# Patient Record
Sex: Female | Born: 1940 | Race: White | Hispanic: No | Smoking: Former smoker
Health system: Southern US, Community
[De-identification: ages and names within clinical notes are randomized; demographics above are authoritative.]

## PROBLEM LIST (undated history)

## (undated) DIAGNOSIS — M7580 Other shoulder lesions, unspecified shoulder: Secondary | ICD-10-CM

## (undated) DIAGNOSIS — E7800 Pure hypercholesterolemia, unspecified: Secondary | ICD-10-CM

## (undated) DIAGNOSIS — M1990 Unspecified osteoarthritis, unspecified site: Secondary | ICD-10-CM

## (undated) DIAGNOSIS — E079 Disorder of thyroid, unspecified: Secondary | ICD-10-CM

## (undated) DIAGNOSIS — F209 Schizophrenia, unspecified: Secondary | ICD-10-CM

## (undated) DIAGNOSIS — K449 Diaphragmatic hernia without obstruction or gangrene: Secondary | ICD-10-CM

## (undated) DIAGNOSIS — R569 Unspecified convulsions: Secondary | ICD-10-CM

## (undated) HISTORY — DX: Unspecified convulsions: R56.9

## (undated) HISTORY — DX: Pure hypercholesterolemia, unspecified: E78.00

## (undated) HISTORY — PX: CHOLECYSTECTOMY: SHX55

## (undated) HISTORY — DX: Disorder of thyroid, unspecified: E07.9

## (undated) HISTORY — PX: OTHER SURGICAL HISTORY: SHX169

## (undated) HISTORY — DX: Schizophrenia, unspecified: F20.9

## (undated) HISTORY — DX: Other shoulder lesions, unspecified shoulder: M75.80

## (undated) HISTORY — DX: Diaphragmatic hernia without obstruction or gangrene: K44.9

## (undated) HISTORY — DX: Unspecified osteoarthritis, unspecified site: M19.90

---

## 2002-07-08 ENCOUNTER — Ambulatory Visit (HOSPITAL_COMMUNITY): Admission: RE | Admit: 2002-07-08 | Discharge: 2002-07-08 | Payer: Self-pay | Admitting: Internal Medicine

## 2002-07-08 ENCOUNTER — Encounter: Payer: Self-pay | Admitting: Internal Medicine

## 2002-08-01 ENCOUNTER — Encounter: Admission: RE | Admit: 2002-08-01 | Discharge: 2002-08-01 | Payer: Self-pay | Admitting: *Deleted

## 2002-08-01 ENCOUNTER — Encounter: Payer: Self-pay | Admitting: *Deleted

## 2002-08-15 ENCOUNTER — Encounter: Admission: RE | Admit: 2002-08-15 | Discharge: 2002-08-15 | Payer: Self-pay | Admitting: *Deleted

## 2002-08-15 ENCOUNTER — Encounter: Payer: Self-pay | Admitting: *Deleted

## 2002-08-29 ENCOUNTER — Encounter: Payer: Self-pay | Admitting: *Deleted

## 2002-08-29 ENCOUNTER — Encounter: Admission: RE | Admit: 2002-08-29 | Discharge: 2002-08-29 | Payer: Self-pay | Admitting: *Deleted

## 2002-10-11 ENCOUNTER — Encounter: Admission: RE | Admit: 2002-10-11 | Discharge: 2002-10-11 | Payer: Self-pay | Admitting: *Deleted

## 2002-10-11 ENCOUNTER — Encounter: Payer: Self-pay | Admitting: *Deleted

## 2002-12-13 ENCOUNTER — Ambulatory Visit (HOSPITAL_COMMUNITY): Admission: RE | Admit: 2002-12-13 | Discharge: 2002-12-13 | Payer: Self-pay | Admitting: Orthopaedic Surgery

## 2003-01-20 ENCOUNTER — Inpatient Hospital Stay (HOSPITAL_COMMUNITY): Admission: RE | Admit: 2003-01-20 | Discharge: 2003-01-22 | Payer: Self-pay | Admitting: Orthopaedic Surgery

## 2003-04-02 ENCOUNTER — Ambulatory Visit (HOSPITAL_COMMUNITY): Admission: RE | Admit: 2003-04-02 | Discharge: 2003-04-02 | Payer: Self-pay | Admitting: Orthopaedic Surgery

## 2003-08-19 ENCOUNTER — Encounter: Payer: Self-pay | Admitting: Orthopedic Surgery

## 2004-07-30 ENCOUNTER — Ambulatory Visit: Payer: Self-pay | Admitting: *Deleted

## 2004-11-24 ENCOUNTER — Ambulatory Visit: Payer: Self-pay | Admitting: Orthopedic Surgery

## 2005-01-18 ENCOUNTER — Ambulatory Visit: Payer: Self-pay | Admitting: Psychiatry

## 2005-02-16 ENCOUNTER — Ambulatory Visit: Payer: Self-pay | Admitting: Orthopedic Surgery

## 2005-05-16 ENCOUNTER — Ambulatory Visit: Payer: Self-pay | Admitting: Orthopedic Surgery

## 2005-06-02 ENCOUNTER — Emergency Department (HOSPITAL_COMMUNITY): Admission: EM | Admit: 2005-06-02 | Discharge: 2005-06-02 | Payer: Self-pay | Admitting: Emergency Medicine

## 2005-07-26 ENCOUNTER — Ambulatory Visit: Payer: Self-pay | Admitting: Psychiatry

## 2005-08-15 ENCOUNTER — Ambulatory Visit: Payer: Self-pay | Admitting: Orthopedic Surgery

## 2005-11-23 ENCOUNTER — Ambulatory Visit: Payer: Self-pay | Admitting: Orthopedic Surgery

## 2005-12-12 ENCOUNTER — Ambulatory Visit: Payer: Self-pay | Admitting: Orthopedic Surgery

## 2006-01-17 ENCOUNTER — Ambulatory Visit (HOSPITAL_COMMUNITY): Payer: Self-pay | Admitting: Psychiatry

## 2006-01-23 ENCOUNTER — Ambulatory Visit: Payer: Self-pay | Admitting: Orthopedic Surgery

## 2006-02-20 ENCOUNTER — Ambulatory Visit: Payer: Self-pay | Admitting: Orthopedic Surgery

## 2006-06-12 ENCOUNTER — Ambulatory Visit: Payer: Self-pay | Admitting: Orthopedic Surgery

## 2006-07-18 ENCOUNTER — Ambulatory Visit (HOSPITAL_COMMUNITY): Payer: Self-pay | Admitting: Psychiatry

## 2006-09-11 ENCOUNTER — Ambulatory Visit: Payer: Self-pay | Admitting: Orthopedic Surgery

## 2006-11-13 ENCOUNTER — Ambulatory Visit: Payer: Self-pay | Admitting: Orthopedic Surgery

## 2007-01-09 ENCOUNTER — Ambulatory Visit (HOSPITAL_COMMUNITY): Payer: Self-pay | Admitting: Psychiatry

## 2007-02-12 ENCOUNTER — Ambulatory Visit: Payer: Self-pay | Admitting: Orthopedic Surgery

## 2007-05-21 ENCOUNTER — Ambulatory Visit: Payer: Self-pay | Admitting: Orthopedic Surgery

## 2007-06-26 ENCOUNTER — Ambulatory Visit (HOSPITAL_COMMUNITY): Payer: Self-pay | Admitting: Psychiatry

## 2007-08-20 ENCOUNTER — Encounter (INDEPENDENT_AMBULATORY_CARE_PROVIDER_SITE_OTHER): Payer: Self-pay | Admitting: Radiology

## 2007-08-20 ENCOUNTER — Ambulatory Visit: Payer: Self-pay | Admitting: Orthopedic Surgery

## 2007-11-19 ENCOUNTER — Ambulatory Visit: Payer: Self-pay | Admitting: Orthopedic Surgery

## 2007-11-19 DIAGNOSIS — M25559 Pain in unspecified hip: Secondary | ICD-10-CM

## 2007-12-04 ENCOUNTER — Ambulatory Visit (HOSPITAL_COMMUNITY): Payer: Self-pay | Admitting: Psychiatry

## 2008-02-20 ENCOUNTER — Ambulatory Visit: Payer: Self-pay | Admitting: Orthopedic Surgery

## 2008-05-21 ENCOUNTER — Ambulatory Visit: Payer: Self-pay | Admitting: Orthopedic Surgery

## 2008-05-27 ENCOUNTER — Ambulatory Visit (HOSPITAL_COMMUNITY): Payer: Self-pay | Admitting: Psychiatry

## 2008-06-24 ENCOUNTER — Ambulatory Visit (HOSPITAL_COMMUNITY): Payer: Self-pay | Admitting: Psychiatry

## 2008-07-22 ENCOUNTER — Ambulatory Visit (HOSPITAL_COMMUNITY): Payer: Self-pay | Admitting: Psychiatry

## 2008-08-19 ENCOUNTER — Ambulatory Visit: Payer: Self-pay | Admitting: Orthopedic Surgery

## 2008-09-02 ENCOUNTER — Ambulatory Visit (HOSPITAL_COMMUNITY): Payer: Self-pay | Admitting: Psychiatry

## 2008-11-19 ENCOUNTER — Ambulatory Visit: Payer: Self-pay | Admitting: Orthopedic Surgery

## 2009-02-03 ENCOUNTER — Ambulatory Visit (HOSPITAL_COMMUNITY): Payer: Self-pay | Admitting: Psychiatry

## 2009-02-25 ENCOUNTER — Ambulatory Visit: Payer: Self-pay | Admitting: Orthopedic Surgery

## 2009-03-24 ENCOUNTER — Telehealth: Payer: Self-pay | Admitting: Orthopedic Surgery

## 2009-05-12 ENCOUNTER — Ambulatory Visit (HOSPITAL_COMMUNITY): Payer: Self-pay | Admitting: Psychiatry

## 2009-05-25 ENCOUNTER — Ambulatory Visit: Payer: Self-pay | Admitting: Orthopedic Surgery

## 2009-05-25 DIAGNOSIS — M19049 Primary osteoarthritis, unspecified hand: Secondary | ICD-10-CM | POA: Insufficient documentation

## 2009-08-11 ENCOUNTER — Ambulatory Visit (HOSPITAL_COMMUNITY): Payer: Self-pay | Admitting: Psychiatry

## 2009-08-31 ENCOUNTER — Ambulatory Visit: Payer: Self-pay | Admitting: Orthopedic Surgery

## 2009-11-30 ENCOUNTER — Ambulatory Visit: Payer: Self-pay | Admitting: Orthopedic Surgery

## 2009-12-15 ENCOUNTER — Ambulatory Visit (HOSPITAL_COMMUNITY): Payer: Self-pay | Admitting: Psychiatry

## 2010-03-01 ENCOUNTER — Ambulatory Visit: Payer: Self-pay | Admitting: Orthopedic Surgery

## 2010-03-09 ENCOUNTER — Ambulatory Visit (HOSPITAL_COMMUNITY): Payer: Self-pay | Admitting: Psychiatry

## 2010-06-08 ENCOUNTER — Ambulatory Visit (HOSPITAL_COMMUNITY): Payer: Self-pay | Admitting: Psychiatry

## 2010-06-16 ENCOUNTER — Ambulatory Visit: Payer: Self-pay | Admitting: Orthopedic Surgery

## 2010-08-17 ENCOUNTER — Ambulatory Visit (HOSPITAL_COMMUNITY): Payer: Self-pay | Admitting: Psychiatry

## 2010-09-29 ENCOUNTER — Ambulatory Visit: Payer: Self-pay | Admitting: Orthopedic Surgery

## 2010-09-29 DIAGNOSIS — M5430 Sciatica, unspecified side: Secondary | ICD-10-CM | POA: Insufficient documentation

## 2010-11-16 ENCOUNTER — Ambulatory Visit (HOSPITAL_COMMUNITY)
Admission: RE | Admit: 2010-11-16 | Discharge: 2010-11-16 | Payer: Self-pay | Source: Home / Self Care | Attending: Psychiatry | Admitting: Psychiatry

## 2010-11-30 NOTE — Assessment & Plan Note (Signed)
Summary: 3 M RE-CK LT HIP,RT THUMB/POSS INJEC/BLUE MEDICARE/CAF   Visit Type:  Follow-up  CC:  left hip pain .  History of Present Illness: For injection left hip   Verbal consent was obtained. The hip over the greater trochanter was prepped with alcohol and ethyl chloride. depomedrol 40mg /cc and sensorcaine .25% 1 cc each was injected. there were no coplications.    Allergies: 1)  ! * Thorizine   Other Orders: Joint Aspirate / Injection, Large (20610) Depo- Medrol 40mg  (J1030)  Patient Instructions: 1)  You have received an injection of cortisone today. You may experience increased pain at the injection site. Apply ice pack to the area for 20 minutes every 2 hours and take 2 xtra strength tylenol every 8 hours. This increased pain will usually resolve in 24 hours. The injection will take effect in 3-10 days.  2)  Please schedule a follow-up appointment in 3 months.

## 2010-11-30 NOTE — Assessment & Plan Note (Signed)
Summary: 3 M RE-CK LT HIP/POSS INJEC/BLUE MEDICARE/CAF   Visit Type:  Follow-up  CC:  left hip pain.  History of Present Illness: Sherry Navarro comes in for her three-month injection LEFT hip  I am injection given over the LEFT hip and trochanter.  Verbal consent was obtained. The hip over the greater trochanter was prepped with alcohol and ethyl chloride. depomedrol 40mg /cc and sensorcaine .25% 1 cc each was injected. there were no coplications.   Allergies: 1)  ! * Thorizine   Other Orders: Joint Aspirate / Injection, Large (20610) Depo- Medrol 40mg  (J1030)  Patient Instructions: 1)  Please schedule a follow-up appointment in 3 months. 2)  You have received an injection of cortisone today. You may experience increased pain at the injection site. Apply ice pack to the area for 20 minutes every 2 hours and take 2 xtra strength tylenol every 8 hours. This increased pain will usually resolve in 24 hours. The injection will take effect in 3-10 days.

## 2010-11-30 NOTE — Assessment & Plan Note (Signed)
Summary: 3 M RE-CK LT HIP/POSS. INJEC/MEDICARE,MUT OM/CAF   Visit Type:  Follow-up  CC:  left hip pain.  History of Present Illness: Sherry Navarro comes in for her 3 month LEFT hip injection.  Injection was given the LEFT hip  Verbal consent was obtained. The hip over the greater trochanter was prepped with alcohol and ethyl chloride. depomedrol 40mg /cc and sensorcaine .25% 1 cc each was injected. there were no coplications.    Allergies: 1)  ! * Thorizine   Impression & Recommendations:  Problem # 1:  HAND, ARTHRITIS, DEGEN./OSTEO (ICD-715.94)  Orders: Joint Aspirate / Injection, Large (20610) Depo- Medrol 40mg  (J1030)  Problem # 2:  HIP PAIN (ICD-719.45)  Orders: Joint Aspirate / Injection, Large (20610) Depo- Medrol 40mg  (J1030)  Patient Instructions: 1)  Please schedule a follow-up appointment in 3 months. 2)  You have received an injection of cortisone today. You may experience increased pain at the injection site. Apply ice pack to the area for 20 minutes every 2 hours and take 2 xtra strength tylenol every 8 hours. This increased pain will usually resolve in 24 hours. The injection will take effect in 3-10 days.

## 2010-11-30 NOTE — Assessment & Plan Note (Signed)
Summary: 3 MO RECK LEFT HIP/MCR/[facility]/BSF   Visit Type:  Follow-up  CC:  left hip pain.  History of Present Illness: I saw Brittanni Manship in the office today for a 3 month followup visit.  She is a 70 years old woman with the complaint of:  left hip.  Patient states that her hip is not bothering her.  She is in for routine LEFT IM cortisone injection to control radicular LEFT leg pain. As long as she maintains a regular dosing schedule. This seems to keep things under control.  Repeat injection, LEFT hip IM in the gluteal region.  Depo-Medrol 40 mg per cc and 2 cc of 1% lidocaine were injected under sterile conditions. A refrigerant was used to anesthetize skin  Allergies: 1)  ! * Thorizine   Other Orders: Joint Aspirate / Injection, Large (20610) Depo- Medrol 40mg  (J1030)  Patient Instructions: 1)  Please schedule a follow-up appointment in 3 months.   Orders Added: 1)  Joint Aspirate / Injection, Large [20610] 2)  Depo- Medrol 40mg  [J1030]

## 2011-01-04 ENCOUNTER — Ambulatory Visit: Payer: Self-pay | Admitting: Orthopedic Surgery

## 2011-01-10 ENCOUNTER — Emergency Department (HOSPITAL_COMMUNITY)
Admission: EM | Admit: 2011-01-10 | Discharge: 2011-01-10 | Disposition: A | Discharge status: Home / Self Care | Payer: Medicare Other | Attending: Emergency Medicine | Admitting: Emergency Medicine

## 2011-01-10 DIAGNOSIS — E039 Hypothyroidism, unspecified: Secondary | ICD-10-CM | POA: Insufficient documentation

## 2011-01-10 DIAGNOSIS — B372 Candidiasis of skin and nail: Secondary | ICD-10-CM | POA: Insufficient documentation

## 2011-01-10 DIAGNOSIS — Z79899 Other long term (current) drug therapy: Secondary | ICD-10-CM | POA: Insufficient documentation

## 2011-01-11 ENCOUNTER — Encounter (INDEPENDENT_AMBULATORY_CARE_PROVIDER_SITE_OTHER): Payer: Medicare Other | Admitting: Psychiatry

## 2011-01-11 DIAGNOSIS — F200 Paranoid schizophrenia: Secondary | ICD-10-CM

## 2011-01-19 ENCOUNTER — Encounter: Payer: Self-pay | Admitting: Orthopedic Surgery

## 2011-01-25 ENCOUNTER — Ambulatory Visit (INDEPENDENT_AMBULATORY_CARE_PROVIDER_SITE_OTHER): Payer: Medicare Other | Admitting: Orthopedic Surgery

## 2011-01-25 ENCOUNTER — Encounter: Payer: Self-pay | Admitting: Orthopedic Surgery

## 2011-01-25 DIAGNOSIS — M25559 Pain in unspecified hip: Secondary | ICD-10-CM

## 2011-01-25 NOTE — Patient Instructions (Signed)
You have received a steroid shot. 15% of patients experience increased pain at the injection site with in the next 24 hours. This is best treated with ice and tylenol extra strength 2 tabs every 8 hours. If you are still having pain please call the office.    

## 2011-01-25 NOTE — Progress Notes (Signed)
Inject left hip

## 2011-01-25 NOTE — Discharge Summary (Signed)
Injection LEFT hip  Verbal consent.  Diagnosis LEFT hip pain.  40 mg of Depo-Medrol and 4 cc of 1% lidocaine plain.  Sterile injection LEFT hip.  No complications

## 2011-03-18 NOTE — Op Note (Signed)
NAME:  ANASHA, PERFECTO                           ACCOUNT NO.:  [id number]   MEDICAL RECORD NO.:  [id number]                   PATIENT TYPE:  INP   LOCATION:  2550                                 FACILITY:  [facility]   PHYSICIAN:  Mark C. Ophelia Charter, M.D.                 DATE OF BIRTH:  [birth year]   DATE OF PROCEDURE:  01/20/2003  DATE OF DISCHARGE:                                 OPERATIVE REPORT   PREOPERATIVE DIAGNOSIS:  L4-5 spinal stenosis.   POSTOPERATIVE DIAGNOSIS:  L4-5 spinal stenosis.   OPERATION PERFORMED:  L4-5 decompression.   SURGEON:  Mark C. Ophelia Charter, M.D.   ASSISTANT:  Genene Churn. Denton Meek.   ANESTHESIA:  General orotracheal anesthesia.   ESTIMATED BLOOD LOSS:  100 ml.   COMPLICATIONS:  None.   INDICATIONS FOR PROCEDURE:  After induction of general anesthesia,  orotracheal intubation, the patient was placed in the [place] frame,  kneeling position with careful padding, positioning, and preoperative Ancef  prophylaxis.  A midline incision was made after the area had been squared  with towels after Betadine Vi-drape had been applied after the prep and  laminectomy sheet had been applied.  The needle was at the 4-5 space which  was the operative level.  An incision was made at the midline.  A self-  retaining retractor was placed, subperiosteal dissection of the spinous  process was performed and the spinous process was removed down to the base  extending to the lamina at L4 and the top portion, which was the top fourth  of L5, was taken.  The ligament was extremely hypertrophic.  There was  narrow distance between the facets.  Considerable spur overhang from the  facet and also ligament hypertrophy contributed to the central stenosis.  Dura was identified proximally and big chunks of ligament were removed  initially dorsally and then in both the right and left gutter.  Bone was  removed out to the level of the pedicle.  Pars was preserved and the nerve  root was palpated  at its exit out the foramen, visualized with no area of  compression with the operating microscope which had been draped and was  brought in once the ligament was visualized.  Palpation of the disk showed  that it was flat, not herniated and dura had resumed its round tubular  structure.  After irrigation with saline solution, there is one area central  at the top of L5 that there was concern there might be a possible dural  tear.  The area was gently palpated with the nerve hook and Valsalva was  performed.  There was no spinal fluid leakage noted and the dura was intact.  Fascia was closed with 0 Vicryl, interrupted 2-0  Vicryl in the subcutaneous tissue, Marcaine infiltration in the skin and  skin staple closure, postoperative dressing.  Instrument count, needle count  was correct.  DESCRIPTION OF PROCEDURE:                                                Mark C. Ophelia Charter, M.D.    MCY/MEDQ  D:  01/20/2003  T:  01/20/2003  Job:  [id number]

## 2011-03-18 NOTE — H&P (Signed)
NAME:  Sherry Navarro, Sherry Navarro                           ACCOUNT NO.:  [id number]   MEDICAL RECORD NO.:  [id number]                   PATIENT TYPE:  INP   LOCATION:  2550                                 FACILITY:  [facility]   PHYSICIAN:  Mark C. Ophelia Charter, M.D.                 DATE OF BIRTH:  1941-03-22   DATE OF ADMISSION:  01/20/2003  DATE OF DISCHARGE:                                HISTORY & PHYSICAL   CHIEF COMPLAINT:  Low back pain and neurogenic claudication.   HISTORY OF PRESENT ILLNESS:  The patient is a 70 year old white female with  a history of L4-5 stenosis, low back pain, and neurogenic claudication who  presented to our office for preoperative evaluation for L4-5 decompression.  She has had progressively worsening symptoms with no response to  conservative treatment. A significant decreased in her daily activity level  due to the ongoing complaint.   CURRENT MEDICATIONS:  1. Synthroid 0.125 mg by mouth each day.  2. Navane 10 mg by mouth at bedtime.  3. Tagamet 300 mg by mouth at bedtime.  4. Benztropine 1 mg by mouth at bedtime.  5. Lipitor 10 mg by mouth each day.  6. Neurontin 300 mg 2-3 times each day.  7. Bextra 20 mg by mouth each day.   ALLERGIES:  No known drug allergies.   PAST MEDICAL/SURGICAL HISTORY:  1. Right below the knee amputations in 1997.  2. Open cholecystectomy.  3. Hypothyroidism.  4. History of seizures.  5. Hypercholesterolemia.  6. Parkinson's disease.  7. History of psychotic disorder.   SOCIAL HISTORY:  The patient is married and she is currently unemployed.  Denies smoking or alcohol consumption.   FAMILY HISTORY:  Noncontributory.   REVIEW OF SYSTEMS:  Currently, the patient denies chest pain, shortness of  breath, abdominal pain, nausea and vomiting. No fever, chills, night sweats,  bleeding disorders. All other systems are noncontributory.   PHYSICAL EXAMINATION:  VITAL SIGNS: Temperature 98.5, pulse 76, respiratory  rate 16, blood  pressure 180/84. Height 5'7. Weight 245 pounds.  GENERAL: A pleasant white female who is alert and oriented times three. In  no acute distress.  HEENT: Head normocephalic and atraumatic. Wears eyeglasses. Pupils are  equal, round, and reactive to light and accommodation. Extraocular muscles  intact. Oral mucosa is pink and moist. Tm's clear, pearly gray and intact.  NECK: Full range of motion. Supple and nontender. Carotids are intact. No  lymphadenopathy.  LUNGS: Clear to auscultation and percussion bilaterally.  BREAST: Not examined.  HEART: Regular rate and rhythm. S1 and S2. No murmur, rub, or gallop.  ABDOMEN: Round and protuberant. Normal bowel sounds times four. Soft and  tender. No palpable masses.  GU: Not examined.  EXTREMITIES: The patient walks with a limp. Decreased lumbar flexion  secondary to discomfort.  NEURO: Bilateral sided muscular tenderness. Bilateral hand tremors. No  evidence of motor weakness.  Sensory and motor function intact. Pedal pulses  are intact. Good range of motion  at the hips, knees and ankles.  SKIN: Warm and dry.   DIAGNOSTIC IMPRESSION:  MRI scan showed severe stenosis at L4-5.   ASSESSMENT:  L4-5 stenosis.   PLAN:  L4-5 decompression. Operative versus non-operative treatments  discussed with the patient. Possible risks and complications such as  bleeding, infection, nerve damage, paralysis, spinal fluid leak, headaches,  fractures, anesthesia complications and re-operation also discussed. All  questions were answered and she wishes to proceed with surgery as scheduled.     Genene Churn. Denton Meek.                      Mark C. Ophelia Charter, M.D.    JMO/MEDQ  D:  01/20/2003  T:  01/20/2003  Job:  [id number]

## 2011-04-05 ENCOUNTER — Encounter (INDEPENDENT_AMBULATORY_CARE_PROVIDER_SITE_OTHER): Payer: Medicare Other | Admitting: Psychiatry

## 2011-04-05 DIAGNOSIS — F200 Paranoid schizophrenia: Secondary | ICD-10-CM

## 2011-04-27 ENCOUNTER — Ambulatory Visit: Payer: Medicare Other | Admitting: Orthopedic Surgery

## 2011-05-03 ENCOUNTER — Ambulatory Visit (INDEPENDENT_AMBULATORY_CARE_PROVIDER_SITE_OTHER): Payer: Medicare Other | Admitting: Orthopedic Surgery

## 2011-05-03 DIAGNOSIS — M7070 Other bursitis of hip, unspecified hip: Secondary | ICD-10-CM

## 2011-05-03 DIAGNOSIS — M76899 Other specified enthesopathies of unspecified lower limb, excluding foot: Secondary | ICD-10-CM

## 2011-05-03 MED ORDER — METHYLPREDNISOLONE ACETATE 40 MG/ML IJ SUSP: 40.0000 mg | Freq: Once | INTRAMUSCULAR | Status: DC

## 2011-05-03 NOTE — Progress Notes (Signed)
LEFT hip injection   Verbal consent  Time out  Indications Depo-Medrol 40 mg per cc 1 cc Lidocaine 1% 3 cc   Alcohol was used to prep the skin.  A 22-gauge spinal needle was used to inject the medication.  Ethyl chloride was used to anesthetize the skin  Medication was injected per complications

## 2011-05-03 NOTE — Patient Instructions (Signed)
You have received a steroid shot. 15% of patients experience increased pain at the injection site with in the next 24 hours. This is best treated with ice and tylenol extra strength 2 tabs every 8 hours. If you are still having pain please call the office.    

## 2011-07-05 ENCOUNTER — Encounter (HOSPITAL_COMMUNITY): Payer: Medicare Other | Admitting: Psychiatry

## 2011-07-12 ENCOUNTER — Encounter (INDEPENDENT_AMBULATORY_CARE_PROVIDER_SITE_OTHER): Payer: Medicare Other | Admitting: Psychiatry

## 2011-07-12 DIAGNOSIS — F200 Paranoid schizophrenia: Secondary | ICD-10-CM

## 2011-07-27 ENCOUNTER — Telehealth: Payer: Self-pay | Admitting: Orthopedic Surgery

## 2011-07-27 NOTE — Telephone Encounter (Signed)
Sherry Navarro called this morning complaining of left hip pain for last 2 weeks.  She saw you on 05/03/11 and received an injection.  She is scheduled For another injection 08/09/11 but is asking to come in sooner.  Told her we may not be able to see her before the 08/09/11 appointment.  Says she is taking Nabumetone 750 mgs once a day ( says this was prescribed by Dr. Janna Arch for osteoarithis) and this does not help her pain.  Said  Advil helps when taken with the Nabumetone, but cannot take Advil  because it constipates her.  Can you prescribe anything else for her to take until she Sees you on 08/09/11.  Uses [facility] in [place]

## 2011-07-28 ENCOUNTER — Other Ambulatory Visit: Payer: Self-pay | Admitting: Orthopedic Surgery

## 2011-07-28 DIAGNOSIS — M25559 Pain in unspecified hip: Secondary | ICD-10-CM

## 2011-07-28 MED ORDER — TRAMADOL-ACETAMINOPHEN 37.5-325 MG PO TABS: 1.0000 | ORAL_TABLET | ORAL | Status: AC | PRN

## 2011-07-28 NOTE — Telephone Encounter (Signed)
Pain med faxed to [facility]

## 2011-07-28 NOTE — Telephone Encounter (Signed)
Relayed message to patient

## 2011-07-29 ENCOUNTER — Telehealth: Payer: Self-pay | Admitting: Orthopedic Surgery

## 2011-07-29 NOTE — Telephone Encounter (Signed)
Sherry Navarro cannot take the Ultracet that was faxed in for her pain, because it has Tylenol in it .  Tylenol gives her headaches.  She did not get the prescription.  If you call in something else, she now uses [name]. Her # [phone]

## 2011-07-29 NOTE — Telephone Encounter (Signed)
I DO NOT HAVE ANOTHER RECOMMENDATION ALL OF THE PAIN RELIEVERS HAVE TYLENOL IN THEM

## 2011-08-01 NOTE — Telephone Encounter (Signed)
Advise the patient of doctor's reply

## 2011-08-09 ENCOUNTER — Encounter: Payer: Self-pay | Admitting: Orthopedic Surgery

## 2011-08-09 ENCOUNTER — Ambulatory Visit (INDEPENDENT_AMBULATORY_CARE_PROVIDER_SITE_OTHER): Payer: Medicare Other | Admitting: Orthopedic Surgery

## 2011-08-09 ENCOUNTER — Ambulatory Visit: Payer: Medicare Other | Admitting: Orthopedic Surgery

## 2011-08-09 VITALS — Ht 66.0 in | Wt 231.0 lb

## 2011-08-09 DIAGNOSIS — M25559 Pain in unspecified hip: Secondary | ICD-10-CM

## 2011-08-09 MED ORDER — METHYLPREDNISOLONE ACETATE 40 MG/ML IJ SUSP: 40.0000 mg | Freq: Once | INTRAMUSCULAR | Status: DC

## 2011-08-09 NOTE — Patient Instructions (Signed)
You have received a steroid shot. 15% of patients experience increased pain at the injection site with in the next 24 hours. This is best treated with ice and tylenol extra strength 2 tabs every 8 hours. If you are still having pain please call the office.    

## 2011-08-09 NOTE — Progress Notes (Signed)
LEFT hip injection  Diagnosis LEFT hip pain history of degenerative disc disease lumbar spine.  Greater trochanter bursitis.  Spinal needle was used to inject point of maximal tenderness LEFT hip  Alcohol was used to prep  At the chloride was used for anesthesia  No complications  Medications injected Depo-Medrol 40 mg.  Lidocaine 1% 3 cc.

## 2011-10-01 ENCOUNTER — Other Ambulatory Visit (HOSPITAL_COMMUNITY): Payer: Self-pay | Admitting: Psychiatry

## 2011-10-04 ENCOUNTER — Encounter (HOSPITAL_COMMUNITY): Payer: Medicare Other | Admitting: Psychiatry

## 2011-10-04 ENCOUNTER — Ambulatory Visit (INDEPENDENT_AMBULATORY_CARE_PROVIDER_SITE_OTHER): Payer: Medicare Other | Admitting: Psychiatry

## 2011-10-04 DIAGNOSIS — F259 Schizoaffective disorder, unspecified: Secondary | ICD-10-CM

## 2011-10-04 MED ORDER — THIOTHIXENE 10 MG PO CAPS: 10.0000 mg | ORAL_CAPSULE | Freq: Every day | ORAL | Status: DC

## 2011-10-04 MED ORDER — BENZTROPINE MESYLATE 1 MG PO TABS: 1.0000 mg | ORAL_TABLET | Freq: Every day | ORAL | Status: DC

## 2011-10-04 NOTE — Progress Notes (Signed)
Patient came for her followup appointment. She has been lately more depressed and sad as her niece who was 72 year old died do to congestive heart failure. 2 years ago patient husband was died in October 25, 2023. Patient lives with her sister who has been very supportive. Patient reported 2 weeks ago when she here about her niece she was very upset and did not slept but now she is recovering and doing better. She is sleeping better and denies any crying spells agitation or anger. She reported her paranoia and hallucination has much control with Navane. She has some tremors for which she takes Cogentin. She does not want to change her medication or dosage. She denies any agitation or paranoid thinking.  Mental status examination Patient is casually dressed and fairly groomed. She maintained fair eye contact. Her speech is slow but clear and coherent. She denies any active or passive suicidal thinking or homicidal thinking. There no psychotic symptoms present at this time. There no delusion or hallucination at this time. Her attention and concentration is fair. She's alert and oriented x3. She denies any auditory or visual hallucination. There are some tremors present but they are chronic in nature. Her insight judgment and impulse control is okay.  Assessment Schizophrenia chronic paranoid type  Plan I will continue her Navane 10 mg at bedtime and Cogentin 1 mg at bedtime. I have explained risks and benefits of medication. I recommended to call if she has questions about the medication or if she feels worsening of her symptoms. Patient is scheduled to have Christmas with her sister. I will see her again in 3 months.

## 2011-11-08 ENCOUNTER — Ambulatory Visit (INDEPENDENT_AMBULATORY_CARE_PROVIDER_SITE_OTHER): Payer: Medicare Other | Admitting: Orthopedic Surgery

## 2011-11-08 ENCOUNTER — Encounter: Payer: Self-pay | Admitting: Orthopedic Surgery

## 2011-11-08 DIAGNOSIS — M5430 Sciatica, unspecified side: Secondary | ICD-10-CM

## 2011-11-08 NOTE — Progress Notes (Signed)
Patient ID: Sherry Navarro, female   DOB: Jan 05, 1941, 71 y.o.   MRN: [id number] Routine followup visit for injection IM LEFT hip chronic back and LEFT leg pain  Injection for Hip pain LEFT hip injection  Verbal consent  Time out  Medication: Depo-Medrol 40 mg.  Lidocaine 1% 1 cc.  The skin was prepped with alcohol and upper chloride and then the injection was performed In the LEFT hip intramuscular region.  There were no complications

## 2011-11-08 NOTE — Patient Instructions (Signed)
You have received a steroid shot. 15% of patients experience increased pain at the injection site with in the next 24 hours. This is best treated with ice and tylenol extra strength 2 tabs every 8 hours. If you are still having pain please call the office.    

## 2011-12-19 ENCOUNTER — Encounter (HOSPITAL_COMMUNITY): Payer: Self-pay

## 2011-12-19 ENCOUNTER — Emergency Department (HOSPITAL_COMMUNITY)
Admission: EM | Admit: 2011-12-19 | Discharge: 2011-12-19 | Disposition: A | Discharge status: Home / Self Care | Payer: Medicare Other | Attending: Emergency Medicine | Admitting: Emergency Medicine

## 2011-12-19 DIAGNOSIS — E079 Disorder of thyroid, unspecified: Secondary | ICD-10-CM | POA: Insufficient documentation

## 2011-12-19 DIAGNOSIS — M1990 Unspecified osteoarthritis, unspecified site: Secondary | ICD-10-CM | POA: Insufficient documentation

## 2011-12-19 DIAGNOSIS — B372 Candidiasis of skin and nail: Secondary | ICD-10-CM | POA: Insufficient documentation

## 2011-12-19 DIAGNOSIS — E789 Disorder of lipoprotein metabolism, unspecified: Secondary | ICD-10-CM | POA: Insufficient documentation

## 2011-12-19 DIAGNOSIS — N898 Other specified noninflammatory disorders of vagina: Secondary | ICD-10-CM | POA: Insufficient documentation

## 2011-12-19 DIAGNOSIS — Z79899 Other long term (current) drug therapy: Secondary | ICD-10-CM | POA: Insufficient documentation

## 2011-12-19 MED ORDER — MICONAZOLE NITRATE 2 % EX OINT: TOPICAL_OINTMENT | CUTANEOUS | Status: DC

## 2011-12-19 NOTE — ED Provider Notes (Signed)
History     CSN: [id number]  Arrival date & time 12/19/11  1051   First MD Initiated Contact with Patient 12/19/11 1119      Chief Complaint  Patient presents with  . Rash    HPI Patient presents to to itching and rash of her groin and some vaginal discharge.  She says it smells like a yeast infection.  She has had a yeast infection under her breasts in the past that got better with creams.  She denies any fevers, chills, dysuria,  poor appetite.  She denies ever having been diagnosed with diabetes.  The rash has been there for 2 months and is getting worse.   Past Medical History  Diagnosis Date  . High cholesterol   . Osteoarthritis   . Thyroid disorder   . AC (acromioclavicular) joint bone spurs     on hip joints   . Hiatal hernia     Past Surgical History  Procedure Date  . 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 Romeo Apple   . Rt knee replacement not by dr. Romeo Apple   . Cholecystectomy     History reviewed. No pertinent family history.  History  Substance Use Topics  . Smoking status: Never Smoker   . Smokeless tobacco: Not on file  . Alcohol Use: No    OB History    Grav Para Term Preterm Abortions TAB SAB Ect Mult Living                  Review of Systems  Constitutional: Negative for fever.  HENT: Negative for congestion.   Eyes: Negative for visual disturbance.  Respiratory: Negative for shortness of breath.   Cardiovascular: Negative for chest pain.  Gastrointestinal: Negative for abdominal pain.  Genitourinary: Positive for vaginal discharge. Negative for dysuria.  Musculoskeletal: Negative for joint swelling.  Skin: Positive for rash.    Allergies  Thorazine  Home Medications   Current Outpatient Rx  Name Route Sig Dispense Refill  . LIPITOR PO Oral Take by mouth.      . BENZTROPINE MESYLATE 1 MG PO TABS Oral Take 1 tablet (1 mg total) by mouth at bedtime. 30 tablet 2  . TAGAMET PO Oral Take by mouth.      . DESLORATADINE 5 MG PO TABS        . GABAPENTIN 300 MG PO TABS Oral Take 300 mg by mouth 3 (three) times daily.      . IBUPROFEN 200 MG PO TABS Oral Take 200 mg by mouth every 6 (six) hours as needed.      Marland Kitchen SYNTHROID PO Oral Take by mouth.      Marland Kitchen MICONAZOLE NITRATE 2 % EX OINT  Apply topically to affected area twice daily until rash resolves.  Then use twice weekly as needed. 30 g 0  . NABUMETONE 500 MG PO TABS Oral Take 500 mg by mouth 2 (two) times daily.      . THIOTHIXENE 10 MG PO CAPS Oral Take 1 capsule (10 mg total) by mouth at bedtime. 30 capsule 2  . TRAMADOL-ACETAMINOPHEN 37.5-325 MG PO TABS        BP 136/93  Pulse 86  Temp(Src) 97.8 F (36.6 C) (Oral)  Resp 20  Ht 5\' 7"  (1.702 m)  Wt 239 lb (108.41 kg)  BMI 37.43 kg/m2  SpO2 100%  Physical Exam  Constitutional: She is oriented to person, place, and time. She appears well-developed and well-nourished. No distress.  HENT:  Head: Normocephalic  and atraumatic.  Mouth/Throat: Oropharynx is clear and moist.  Eyes: EOM are normal. Pupils are equal, round, and reactive to light.  Neck: Normal range of motion. Neck supple.  Genitourinary: Vagina normal. No vaginal discharge found.       Pt has erythema consistent with candida infection in fold of left groin.   Neurological: She is alert and oriented to person, place, and time.    ED Course  Procedures (including critical care time)  Labs Reviewed - No data to display No results found.   1. Candidal dermatitis       MDM  Rx for antifungal cream written.  Pt to follow up with PCP.         Ardyth Gal, MD 12/19/11 1204

## 2011-12-19 NOTE — ED Notes (Signed)
Pt reports red rash in groin area and itching for 2 months.

## 2011-12-19 NOTE — ED Provider Notes (Signed)
I saw and evaluated the patient, reviewed the resident's note and I agree with the findings and plan.  Topanga Alvelo P Natassja Ollis, MD 12/19/11 1607 

## 2011-12-19 NOTE — Discharge Instructions (Signed)
Candida Infection, Adult A candida infection (also called yeast, fungus and Monilia infection) is an overgrowth of yeast that can occur anywhere on the body. A yeast infection commonly occurs in warm, moist body areas. Usually, the infection remains localized but can spread to become a systemic infection. A yeast infection may be a sign of a more severe disease such as diabetes, leukemia, or AIDS. A yeast infection can occur in both men and women. In women, Candida vaginitis is a vaginal infection. It is one of the most common causes of vaginitis. Men usually do not have symptoms or know they have an infection until other problems develop. Men may find out they have a yeast infection because their sex partner has a yeast infection. Uncircumcised men are more likely to get a yeast infection than circumcised men. This is because the uncircumcised glans is not exposed to air and does not remain as dry as that of a circumcised glans. Older adults may develop yeast infections around dentures. CAUSES  Women  Antibiotics.   Steroid medication taken for a long time.   Being overweight (obese).   Diabetes.   Poor immune condition.   Certain serious medical conditions.   Immune suppressive medications for organ transplant patients.   Chemotherapy.   Pregnancy.   Menstration.   Stress and fatigue.   Intravenous drug use.   Oral contraceptives.   Wearing tight-fitting clothes in the crotch area.   Catching it from a sex partner who has a yeast infection.   Spermicide.   Intravenous, urinary, or other catheters.  Men  Catching it from a sex partner who has a yeast infection.   Having oral or anal sex with a person who has the infection.   Spermicide.   Diabetes.   Antibiotics.   Poor immune system.   Medications that suppress the immune system.   Intravenous drug use.   Intravenous, urinary, or other catheters.  SYMPTOMS  Women  Thick, white vaginal discharge.    Vaginal itching.   Redness and swelling in and around the vagina.   Irritation of the lips of the vagina and perineum.   Blisters on the vaginal lips and perineum.   Painful sexual intercourse.   Low blood sugar (hypoglycemia).   Painful urination.   Bladder infections.   Intestinal problems such as constipation, indigestion, bad breath, bloating, increase in gas, diarrhea, or loose stools.  Men  Men may develop intestinal problems such as constipation, indigestion, bad breath, bloating, increase in gas, diarrhea, or loose stools.   Dry, cracked skin on the penis with itching or discomfort.   Jock itch.   Dry, flaky skin.   Athlete's foot.   Hypoglycemia.  DIAGNOSIS  Women  A history and an exam are performed.   The discharge may be examined under a microscope.   A culture may be taken of the discharge.  Men  A history and an exam are performed.   Any discharge from the penis or areas of cracked skin will be looked at under the microscope and cultured.   Stool samples may be cultured.  TREATMENT  Women  Vaginal antifungal suppositories and creams.   Medicated creams to decrease irritation and itching on the outside of the vagina.   Warm compresses to the perineal area to decrease swelling and discomfort.   Oral antifungal medications.   Medicated vaginal suppositories or cream for repeated or recurrent infections.   Wash and dry the irritation areas before applying the cream.     Eating yogurt with lactobacillus may help with prevention and treatment.   Sometimes painting the vagina with gentian violet solution may help if creams and suppositories do not work.  Men  Antifungal creams and oral antifungal medications.   Sometimes treatment must continue for 30 days after the symptoms go away to prevent recurrence.  HOME CARE INSTRUCTIONS  Women  Use cotton underwear and avoid tight-fitting clothing.   Avoid colored, scented toilet paper and  deodorant tampons or pads.   Do not douche.   Keep your diabetes under control.   Finish all the prescribed medications.   Keep your skin clean and dry.   Consume milk or yogurt with lactobacillus active culture regularly. If you get frequent yeast infections and think that is what the infection is, there are over-the-counter medications that you can get. If the infection does not show healing in 3 days, talk to your caregiver.   Tell your sex partner you have a yeast infection. Your partner may need treatment also, especially if your infection does not clear up or recurs.  Men  Keep your skin clean and dry.   Keep your diabetes under control.   Finish all prescribed medications.   Tell your sex partner that you have a yeast infection so they can be treated if necessary.  SEEK MEDICAL CARE IF:   Your symptoms do not clear up or worsen in one week after treatment.   You have an oral temperature above 102 F (38.9 C).   You have trouble swallowing or eating for a prolonged time.   You develop blisters on and around your vagina.   You develop vaginal bleeding and it is not your menstrual period.   You develop abdominal pain.   You develop intestinal problems as mentioned above.   You get weak or lightheaded.   You have painful or increased urination.   You have pain during sexual intercourse.  MAKE SURE YOU:   Understand these instructions.   Will watch your condition.   Will get help right away if you are not doing well or get worse.  Document Released: 11/24/2004 Document Revised: 06/29/2011 Document Reviewed: 03/08/2010 [employer] Patient Information 2012 [employer]. 

## 2012-01-03 ENCOUNTER — Ambulatory Visit (INDEPENDENT_AMBULATORY_CARE_PROVIDER_SITE_OTHER): Payer: Medicare Other | Admitting: Psychiatry

## 2012-01-03 ENCOUNTER — Encounter (HOSPITAL_COMMUNITY): Payer: Self-pay | Admitting: Psychiatry

## 2012-01-03 DIAGNOSIS — F202 Catatonic schizophrenia: Secondary | ICD-10-CM

## 2012-01-03 MED ORDER — THIOTHIXENE 10 MG PO CAPS: 10.0000 mg | ORAL_CAPSULE | Freq: Every day | ORAL | Status: DC

## 2012-01-03 MED ORDER — BENZTROPINE MESYLATE 1 MG PO TABS
1.0000 mg | ORAL_TABLET | Freq: Every day | ORAL | Status: DC
Start: 2012-01-03 — End: 2012-04-03

## 2012-01-03 NOTE — Progress Notes (Signed)
Chief complaint I'm doing better on her medication  History of present illness Patient is 71 year old Caucasian female who came for her followup appointment. Patient is very excited that she recently received a picture of her son and granddaughter who live in [place]. Patient talked to them 2 months ago. She had a good Christmas with her sister. Recently she feels overwhelmed as she is changing her bank. She is also concerned about finances. However her mood has been stable. She sleeps fine. She reported no side effects of medication. She lives with the sister. She denies any crying spells, agitation anger or mood swings. She reported her paranoia and hallucination has much control with Navane. She has some tremors for which she takes Cogentin. She does not want to change her medication or dosage.   Mental status examination Patient is casually dressed and fairly groomed. She maintained fair eye contact. Her speech is slow but clear and coherent. She denies any active or passive suicidal thinking or homicidal thinking. She described her mood happy and her affect is mood congruent. There no psychotic symptoms present at this time. There no delusion or hallucination at this time. Her attention and concentration is fair. She's alert and oriented x3. She denies any auditory or visual hallucination. There are some tremors present but they are chronic in nature. Her insight judgment and impulse control is okay.  Assessment Axis I Schizophrenia chronic paranoid type Axis II deferred Axis III see medical history Axis IV mild to moderate  Plan I will continue her Navane 10 mg at bedtime and Cogentin 1 mg at bedtime. I have explained risks and benefits of medication. I recommended to call if she has questions about the medication or if she feels worsening of her symptoms. I will see her again in 3 months.

## 2012-02-07 ENCOUNTER — Ambulatory Visit: Payer: Medicare Other | Admitting: Orthopedic Surgery

## 2012-02-08 ENCOUNTER — Encounter: Payer: Self-pay | Admitting: Orthopedic Surgery

## 2012-02-08 ENCOUNTER — Ambulatory Visit (INDEPENDENT_AMBULATORY_CARE_PROVIDER_SITE_OTHER): Payer: Medicare Other | Admitting: Orthopedic Surgery

## 2012-02-08 VITALS — BP 132/70 | Ht 67.0 in | Wt 239.0 lb

## 2012-02-08 DIAGNOSIS — M25559 Pain in unspecified hip: Secondary | ICD-10-CM

## 2012-02-08 DIAGNOSIS — M5430 Sciatica, unspecified side: Secondary | ICD-10-CM

## 2012-02-08 NOTE — Patient Instructions (Signed)
You have received a steroid shot. 15% of patients experience increased pain at the injection site with in the next 24 hours. This is best treated with ice and tylenol extra strength 2 tabs every 8 hours. If you are still having pain please call the office.    

## 2012-02-08 NOTE — Progress Notes (Signed)
Patient ID: Sherry Navarro, female   DOB: 01-Feb-1941, 71 y.o.   MRN: [id number] Chief Complaint  Patient presents with  . Hip Pain    left hip pain, request shot last 11/08/11    Left Hip Injection Verbal consent was obtained  Timeout procedure was completed  Alcohol was used as a prep with ethyl chloride as an anesthetizing agent  Using a 22-gauge spinal needle the greater trochanter bursa was entered and injected with Depo-Medrol 40 mg.  And 3 cc of 1% lidocaine   Tolerated with no complications

## 2012-04-03 ENCOUNTER — Ambulatory Visit (INDEPENDENT_AMBULATORY_CARE_PROVIDER_SITE_OTHER): Payer: Medicare Other | Admitting: Psychiatry

## 2012-04-03 ENCOUNTER — Encounter (HOSPITAL_COMMUNITY): Payer: Self-pay | Admitting: Psychiatry

## 2012-04-03 DIAGNOSIS — F202 Catatonic schizophrenia: Secondary | ICD-10-CM

## 2012-04-03 DIAGNOSIS — Z79899 Other long term (current) drug therapy: Secondary | ICD-10-CM

## 2012-04-03 DIAGNOSIS — F200 Paranoid schizophrenia: Secondary | ICD-10-CM

## 2012-04-03 MED ORDER — BENZTROPINE MESYLATE 1 MG PO TABS
1.0000 mg | ORAL_TABLET | Freq: Every day | ORAL | Status: DC
Start: 2012-04-03 — End: 2012-07-03

## 2012-04-03 MED ORDER — THIOTHIXENE 10 MG PO CAPS: 10.0000 mg | ORAL_CAPSULE | Freq: Every day | ORAL | Status: DC

## 2012-04-03 NOTE — Progress Notes (Signed)
Chief complaint Medication management and followup.    History of present illness Patient is 71 year old Caucasian female who came for her followup appointment.  Patient has been compliant with her psychiatric medication.  She admitted having tense moment with her sister but overall she is handling well.  She is anxious about upcoming eye surgery and Tuesday.  She has any agitation anger mood swing.  She denies any crying spells.  She sleeps better.  She does not want to change her medication despite she has tremors and shakes from Navane.  She denies any recent paranoia or any hallucination.    Current psychiatric medication Navane 10 mg at bedtime  Cogentin 1 mg at bedtime   Medical history The patient has history of hyperlipidemia, Oster arthritis, hypothyroidism and hiatal hernia.  Her primary care physician is Dr. Lorrene Reid.  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  She has been admitted at least 3 times for psychosis and delusional behavior.  She is been stable on her current psychiatric medication for a long time and is scared to change them.  Mental status examination Patient is casually dressed and fairly groomed. She is elderly woman who appears to be in her stated age.  She maintained fair eye contact.  She is tremulous.  She maintained fair eye contact. Her speech is slow but clear and coherent. She denies any active or passive suicidal thinking or homicidal thinking. She described her mood is okay and her affect is mood congruent. There no psychotic symptoms present at this time. There no delusion or hallucination at this time. Her attention and concentration is fair. She's alert and oriented x3. She denies any auditory or visual hallucination. There are some tremors present but they are chronic in nature. Her insight judgment and impulse control is okay.  Assessment Axis I Schizophrenia chronic paranoid type Axis II  deferred Axis III see medical history Axis IV mild to moderate  Plan I will continue her Navane 10 mg at bedtime and Cogentin 1 mg at bedtime. I have explained risks and benefits of medication.  I will order CBC, CMP, hemoglobin A1c and recommend to have these blood test before her next appointment.  I recommended to call if she has questions about the medication or if she feels worsening of her symptoms. I will see her again in 3 months.

## 2012-04-04 LAB — COMPREHENSIVE METABOLIC PANEL
ALT: 22 U/L (ref 0–35)
AST: 26 U/L (ref 0–37)
Albumin: 3.9 g/dL (ref 3.5–5.2)
Alkaline Phosphatase: 70 U/L (ref 39–117)
BUN: 6 mg/dL (ref 6–23)
CO2: 30 mEq/L (ref 19–32)
Calcium: 9.4 mg/dL (ref 8.4–10.5)
Chloride: 104 mEq/L (ref 96–112)
Glucose, Bld: 82 mg/dL (ref 70–99)
Total Bilirubin: 0.4 mg/dL (ref 0.3–1.2)

## 2012-04-04 LAB — CBC WITH DIFFERENTIAL/PLATELET
Basophils Relative: 1 % (ref 0–1)
HCT: 44.3 % (ref 36.0–46.0)
Lymphs Abs: 1.9 10*3/uL (ref 0.7–4.0)
MCV: 93.3 fL (ref 78.0–100.0)
Monocytes Absolute: 1 10*3/uL (ref 0.1–1.0)
RBC: 4.75 MIL/uL (ref 3.87–5.11)

## 2012-04-04 LAB — HEMOGLOBIN A1C: Mean Plasma Glucose: 111 mg/dL (ref <117)

## 2012-06-06 ENCOUNTER — Encounter: Payer: Self-pay | Admitting: Orthopedic Surgery

## 2012-06-06 ENCOUNTER — Ambulatory Visit (INDEPENDENT_AMBULATORY_CARE_PROVIDER_SITE_OTHER): Payer: Medicare Other | Admitting: Orthopedic Surgery

## 2012-06-06 ENCOUNTER — Ambulatory Visit: Payer: Medicare Other | Admitting: Orthopedic Surgery

## 2012-06-06 VITALS — BP 120/60 | Ht 67.0 in | Wt 235.0 lb

## 2012-06-06 DIAGNOSIS — M5430 Sciatica, unspecified side: Secondary | ICD-10-CM

## 2012-06-06 DIAGNOSIS — M259 Joint disorder, unspecified: Secondary | ICD-10-CM

## 2012-06-06 NOTE — Patient Instructions (Signed)
You have received a steroid shot. 15% of patients experience increased pain at the injection site with in the next 24 hours. This is best treated with ice and tylenol extra strength 2 tabs every 8 hours. If you are still having pain please call the office.    

## 2012-06-06 NOTE — Progress Notes (Signed)
Patient ID: Sherry Navarro, female   DOB: [birth year], 71 y.o.   MRN: [id number] Left Hip Injection Verbal consent was obtained  Timeout procedure was completed  Alcohol was used as a prep with ethyl chloride as an anesthetizing agent  Using a 22-gauge spinal needle the greater trochanter bursa was entered and injected with Depo-Medrol 40 mg.  And 3 cc of 1% lidocaine   Tolerated with no complications

## 2012-07-03 ENCOUNTER — Ambulatory Visit (INDEPENDENT_AMBULATORY_CARE_PROVIDER_SITE_OTHER): Payer: Medicare Other | Admitting: Psychiatry

## 2012-07-03 ENCOUNTER — Encounter (HOSPITAL_COMMUNITY): Payer: Self-pay | Admitting: Psychiatry

## 2012-07-03 VITALS — Wt 235.0 lb

## 2012-07-03 DIAGNOSIS — F200 Paranoid schizophrenia: Secondary | ICD-10-CM

## 2012-07-03 DIAGNOSIS — F202 Catatonic schizophrenia: Secondary | ICD-10-CM

## 2012-07-03 MED ORDER — THIOTHIXENE 10 MG PO CAPS: 10.0000 mg | ORAL_CAPSULE | Freq: Every day | ORAL | Status: DC

## 2012-07-03 MED ORDER — BENZTROPINE MESYLATE 1 MG PO TABS
1.0000 mg | ORAL_TABLET | Freq: Every day | ORAL | Status: DC
Start: 2012-07-03 — End: 2012-10-02

## 2012-07-03 NOTE — Progress Notes (Signed)
Chief complaint Medication management and followup.    History of present illness Patient is 71 year old Caucasian female who came for her followup appointment.  Patient has been compliant with her psychiatric medication.  She denies any side effects.  She's concern about her family member .  Apparently her sister is nephew is not doing very well.  He is depressed .  Overall patient is doing very well.  His nephew is helping her by fixing small house projects .  Patient lives with his sisters.  Her sister is very cooperative and helpful.  Patient does not want to continue medication.  She is doing very well.  She denies any insomnia crying spells agitation anger mood swing.  She has some tremors but they are chronic.  She's not drinking or using any illegal substance.  Her paranoia and hallucination has been much control with navane.  Patient has a blood work which was done in June 2013.  She has a normal CBC, CMP and her hemoglobin A1c is 5.5.  Current psychiatric medication Navane 10 mg at bedtime  Cogentin 1 mg at bedtime   Medical history The patient has history of hyperlipidemia, Oster arthritis, hypothyroidism and hiatal hernia.  Her primary care physician is Dr. Lorrene Reid.  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  She has been admitted at least 3 times for psychosis and delusional behavior.  She is been stable on her current psychiatric medication for a long time and is scared to change them.  Psychosocial history Patient lives with her sister.  She has one son who lives in [place].  Mental status examination Patient is casually dressed and fairly groomed. She is elderly woman who appears to be in her stated age.  She maintained fair eye contact.  She is tremulous.  She maintained fair eye contact. Her speech is slow but clear and coherent. She denies any active or passive suicidal thinking or homicidal thinking. She described her mood  is okay and her affect is mood congruent. There no psychotic symptoms present at this time. There no delusion or hallucination at this time. Her attention and concentration is fair. She's alert and oriented x3. She denies any auditory or visual hallucination. There are some tremors present but they are chronic in nature. Her insight judgment and impulse control is okay.  Assessment Axis I Schizophrenia chronic paranoid type Axis II deferred Axis III see medical history Axis IV mild to moderate  Plan I reviewed her weight , blood test results , last progress note and response to the medication.  Patient is doing better on her current psychiatric medication.  I will continue her Navane 10 mg at bedtime and Cogentin 1 mg at bedtime. I have explained risks and benefits of medication. I recommended to call if she has questions about the medication or if she feels worsening of her symptoms. I will see her again in 3 months.

## 2012-09-12 ENCOUNTER — Ambulatory Visit (INDEPENDENT_AMBULATORY_CARE_PROVIDER_SITE_OTHER): Payer: Medicare Other | Admitting: Orthopedic Surgery

## 2012-09-12 ENCOUNTER — Encounter: Payer: Self-pay | Admitting: Orthopedic Surgery

## 2012-09-12 VITALS — Ht 67.0 in | Wt 235.0 lb

## 2012-09-12 DIAGNOSIS — M25559 Pain in unspecified hip: Secondary | ICD-10-CM

## 2012-09-12 DIAGNOSIS — M76899 Other specified enthesopathies of unspecified lower limb, excluding foot: Secondary | ICD-10-CM

## 2012-09-12 DIAGNOSIS — M7070 Other bursitis of hip, unspecified hip: Secondary | ICD-10-CM

## 2012-09-12 NOTE — Progress Notes (Signed)
Patient ID: Sherry Navarro, female   DOB: [birth year], 71 y.o.   MRN: [id number] Chief Complaint  Patient presents with  . Follow-up    3 month recheck on left hip with injection.    Left Hip Injection Verbal consent was obtained  Timeout procedure was completed  Alcohol was used as a prep with ethyl chloride as an anesthetizing agent  Using a 22-gauge spinal needle the greater trochanter bursa was entered and injected with Depo-Medrol 40 mg.  And 3 cc of 1% lidocaine   Tolerated with no complications

## 2012-09-12 NOTE — Patient Instructions (Addendum)
You have received a steroid shot. 15% of patients experience increased pain at the injection site with in the next 24 hours. This is best treated with ice and tylenol extra strength 2 tabs every 8 hours. If you are still having pain please call the office.    

## 2012-09-24 ENCOUNTER — Other Ambulatory Visit (HOSPITAL_COMMUNITY): Payer: Self-pay | Admitting: Psychiatry

## 2012-09-25 ENCOUNTER — Other Ambulatory Visit (HOSPITAL_COMMUNITY): Payer: Self-pay | Admitting: *Deleted

## 2012-09-25 DIAGNOSIS — F202 Catatonic schizophrenia: Secondary | ICD-10-CM

## 2012-09-25 MED ORDER — THIOTHIXENE 10 MG PO CAPS: 10.0000 mg | ORAL_CAPSULE | Freq: Every day | ORAL | Status: DC

## 2012-10-02 ENCOUNTER — Encounter (HOSPITAL_COMMUNITY): Payer: Self-pay | Admitting: Psychiatry

## 2012-10-02 ENCOUNTER — Ambulatory Visit (INDEPENDENT_AMBULATORY_CARE_PROVIDER_SITE_OTHER): Payer: Medicare Other | Admitting: Psychiatry

## 2012-10-02 VITALS — BP 130/80 | HR 80 | Ht 65.5 in | Wt 239.6 lb

## 2012-10-02 DIAGNOSIS — F209 Schizophrenia, unspecified: Secondary | ICD-10-CM | POA: Insufficient documentation

## 2012-10-02 DIAGNOSIS — F200 Paranoid schizophrenia: Secondary | ICD-10-CM

## 2012-10-02 DIAGNOSIS — F202 Catatonic schizophrenia: Secondary | ICD-10-CM

## 2012-10-02 MED ORDER — THIOTHIXENE 10 MG PO CAPS: 10.0000 mg | ORAL_CAPSULE | Freq: Every day | ORAL | Status: DC

## 2012-10-02 MED ORDER — BENZTROPINE MESYLATE 1 MG PO TABS
1.0000 mg | ORAL_TABLET | Freq: Every day | ORAL | Status: DC
Start: 2012-10-02 — End: 2013-01-01

## 2012-10-02 NOTE — Patient Instructions (Signed)
Pt is concerned about her weight.  Cut back on sugar and carbohydrates, that means very limited fruits and starchy vegetables and very limited grains, breads  The goal is low GLYCEMIC INDEX.  Eat avocados, eggs, lean meat like grass fed beef and chicken

## 2012-10-02 NOTE — Progress Notes (Signed)
Chief complaint Medication management and followup.    History of present illness Patient is 71 year old Caucasian female who came for her followup appointment.  She is compliant with her psychotorpic medications.  She has significant tremors that interfere with her using her Left hand as much as she might.  She was offered Inderal to help with that and she flatly refused to change anything.  She has a possum that is in the walls of her house and is trying to get in through the central heating register.  Her sister feeds stray cats on the opposite side of the house and she is afraid that the possum will kill the cats.  She plans to call animal control to help alleviate the situation.   Current psychiatric medication Navane 10 mg at bedtime  Cogentin 1 mg at bedtime   Medical history The patient has history of hyperlipidemia, Oster arthritis, hypothyroidism and hiatal hernia.  Her primary care physician is Dr. Lorrene Reid.  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  She has been admitted at least 3 times for psychosis and delusional behavior.  She is been stable on her current psychiatric medication for a long time and is scared to change them.  Psychosocial history Patient lives with her sister.  She has one son who lives in [place].  Mental status examination Patient is casually dressed and fairly groomed. She is elderly woman who appears to be in her stated age.  She maintained fair eye contact.  She is tremulous.  She maintained fair eye contact. Her speech is slow but clear and coherent. She denies any active or passive suicidal thinking or homicidal thinking. She described her mood is okay and her affect is mood congruent. There no psychotic symptoms present at this time. There no delusion or hallucination at this time. Her attention and concentration is fair. She's alert and oriented x3. She denies any auditory or visual hallucination. There  are some tremors present but they are chronic in nature. Her insight judgment and impulse control is okay.  Assessment Axis I Schizophrenia chronic paranoid type Axis II deferred Axis III see medical history Axis IV mild to moderate  Plan I took her vitals.  I reviewed CC, tobacco/med/surg Hx, meds effects/ side effects, problem list, therapies and responses as well as current situation/symptoms discussed options. See orders and pt instructions for more details.

## 2012-12-04 ENCOUNTER — Telehealth (HOSPITAL_COMMUNITY): Payer: Self-pay | Admitting: Psychiatry

## 2012-12-04 MED ORDER — PERPHENAZINE 8 MG PO TABS: 20.0000 mg | ORAL_TABLET | Freq: Two times a day (BID) | ORAL | Status: DC

## 2012-12-04 NOTE — Telephone Encounter (Signed)
Equivalent dose of Trilafon ordered.

## 2012-12-04 NOTE — Telephone Encounter (Signed)
Phone message completed in the phone message section.  

## 2012-12-05 ENCOUNTER — Telehealth (HOSPITAL_COMMUNITY): Payer: Self-pay | Admitting: Psychiatry

## 2012-12-06 ENCOUNTER — Telehealth (HOSPITAL_COMMUNITY): Payer: Self-pay | Admitting: Psychiatry

## 2012-12-06 NOTE — Telephone Encounter (Signed)
Phone message completed in the phone message section.  

## 2012-12-10 ENCOUNTER — Telehealth (HOSPITAL_COMMUNITY): Payer: Self-pay | Admitting: Psychiatry

## 2012-12-10 NOTE — Telephone Encounter (Signed)
Phone message completed in the phone message section.  

## 2012-12-19 ENCOUNTER — Ambulatory Visit (INDEPENDENT_AMBULATORY_CARE_PROVIDER_SITE_OTHER): Payer: Medicare Other | Admitting: Orthopedic Surgery

## 2012-12-19 VITALS — BP 130/80 | Ht 67.0 in | Wt 235.0 lb

## 2012-12-19 NOTE — Patient Instructions (Signed)
You have received a steroid shot. 15% of patients experience increased pain at the injection site with in the next 24 hours. This is best treated with ice and tylenol extra strength 2 tabs every 8 hours. If you are still having pain please call the office.    

## 2012-12-19 NOTE — Progress Notes (Signed)
Patient ID: Sherry Navarro, female   DOB: 1941-02-22, 72 y.o.   MRN: [id number] Follow-up        3 month recheck on left hip with injection.     Left Hip Injection Verbal consent was obtained  Timeout procedure was completed   Alcohol was used as a prep with ethyl chloride as an anesthetizing agent  Using a 22-gauge spinal needle the greater trochanter bursa was entered and injected with Depo-Medrol 40 mg.  And 3 cc of 1% lidocaine   Tolerated with no complications

## 2013-01-01 ENCOUNTER — Ambulatory Visit (INDEPENDENT_AMBULATORY_CARE_PROVIDER_SITE_OTHER): Payer: Medicare Other | Admitting: Psychiatry

## 2013-01-01 ENCOUNTER — Encounter (HOSPITAL_COMMUNITY): Payer: Self-pay | Admitting: Psychiatry

## 2013-01-01 VITALS — Wt 243.0 lb

## 2013-01-01 DIAGNOSIS — F200 Paranoid schizophrenia: Secondary | ICD-10-CM

## 2013-01-01 MED ORDER — PERPHENAZINE 8 MG PO TABS
20.0000 mg | ORAL_TABLET | Freq: Two times a day (BID) | ORAL | Status: DC
Start: 2013-01-01 — End: 2013-03-12

## 2013-01-01 MED ORDER — VITAMIN E 180 MG (400 UNIT) PO CAPS: 400.0000 [IU] | ORAL_CAPSULE | Freq: Every day | ORAL | Status: DC

## 2013-01-01 MED ORDER — BENZTROPINE MESYLATE 1 MG PO TABS: 1.0000 mg | ORAL_TABLET | Freq: Every day | ORAL | Status: DC

## 2013-01-01 NOTE — Patient Instructions (Addendum)
May try taking just 2 of the new pills and leave off the half.  [employer] for [employer] by Wyn Quaker Schafe may be an interesting resource for you. Some people have gotten it for a penny from [employer]  Ask God to help you learn how to let go of your worries and fears.  CUT BACK/CUT OUT on sugar and carbohydrates, that means very limited fruits and starchy vegetables and very limited grains, breads  The goal is low GLYCEMIC INDEX.  CUT OUT all wheat, rye, or barley for the GLUTEN in them.  HIGH fat and LOW carbohydrate diet is the KEY.  Eat avocados, eggs, lean meat like grass fed beef and chicken  Nuts and seeds would be good foods as well.   Stevia is an excellent sweetener.  Safe for the brain.   Lowella Grip is also a good safe sweetener.  Almond butter is awesome.  Check out all this on the Internet.  Set a timer for 8 minutes and walk for that amount of time in the house or in the yard.  Mark "8" on a calendar for that day.  Do that every day this week.  Then next week increase the time to 9 minutes and then mark the calendar with a 9 for that day.  Each week increase your exercise by one minute.  Keep a record of this so you can see what progress you are making.  Do this every day, just like eating and sleeping.  It is good for pain control, depression, and for your soul/spirit.  Bring the record in for your nest visit so we can talk about your effort and how you feel with the new exercise program going and working for you.  Call if problems or concerns.

## 2013-01-01 NOTE — Progress Notes (Signed)
[facility] [id number] Progress Note Sherry Navarro MRN: [id number] DOB: 09-18-1941 Age: 72 y.o.  Date: 01/01/2013 Start Time: 1:40 PM End Time: 2:05 PM  Chief Complaint: Chief Complaint  Patient presents with  . Other  . Follow-up  . Medication Refill   Subjective: "I'm doing pretty good". Depression 0/10 and Anxiety 4 or 5/10, where 1 is the best and 10 is the worst.  Pain is 0/10 in her back from taking nabumetone 750.  History of present illness Patient is 72 year old Caucasian female who came for her followup appointment.  Pt reports that she is compliant with the psychotropic medications with good benefit and some side effects.  She still shakes like she did with the Navane.  She has noted no benefit from doubling up the Cogentin for it.  She has not taken Vitamin E for this.  She has a slight tremor, but no cog wheeling or dyskinseia of the tongue.  Current psychiatric medication Navane 10 mg at bedtime  Cogentin 1 mg at bedtime   Medical history The patient has history of hyperlipidemia, Oster arthritis, hypothyroidism and hiatal hernia.  Her primary care physician is Dr. Lorrene Reid.  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  She has been admitted at least 3 times for psychosis and delusional behavior.  She is been stable on her current psychiatric medication for a long time and is scared to change them.  Psychosocial history Patient lives with her sister.  She has one son who lives in [place].  Family History family history includes Depression in her other.  There is no history of Schizophrenia, and ADD / ADHD, and Alcohol abuse, and Drug abuse, and Anxiety disorder, and Bipolar disorder, and Dementia, and OCD, and Paranoid behavior, and Seizures, and Sexual abuse, and Physical abuse, .  Mental status examination Patient is casually dressed and fairly groomed. She is elderly woman who appears to be in her stated  age.  She maintained fair eye contact.  She is tremulous.  She maintained fair eye contact. Her speech is slow but clear and coherent. She denies any active or passive suicidal thinking or homicidal thinking. She described her mood is okay and her affect is mood congruent. There no psychotic symptoms present at this time. There no delusion or hallucination at this time. Her attention and concentration is fair. She's alert and oriented x3. She denies any auditory or visual hallucination. There are some tremors present but they are chronic in nature. Her insight judgment and impulse control is okay.  Lab Results:  Results for orders placed in visit on 04/03/12 (from the past 8736 hour(s))  CBC WITH DIFFERENTIAL   Collection Time    04/03/12  2:05 PM      Result Value Range   WBC 6.9  4.0 - 10.5 K/uL   RBC 4.75  3.87 - 5.11 MIL/uL   Hemoglobin 15.1 (*) 12.0 - 15.0 g/dL   HCT 57.8  46.9 - 62.9 %   MCV 93.3  78.0 - 100.0 fL   MCH 31.8  26.0 - 34.0 pg   MCHC 34.1  30.0 - 36.0 g/dL   RDW 52.8  41.3 - 24.4 %   Platelets 233  150 - 400 K/uL   Neutrophils Relative 52  43 - 77 %   Neutro Abs 3.6  1.7 - 7.7 K/uL   Lymphocytes Relative 28  12 - 46 %   Lymphs Abs 1.9  0.7 - 4.0  K/uL   Monocytes Relative 14 (*) 3 - 12 %   Monocytes Absolute 1.0  0.1 - 1.0 K/uL   Eosinophils Relative 5  0 - 5 %   Eosinophils Absolute 0.3  0.0 - 0.7 K/uL   Basophils Relative 1  0 - 1 %   Basophils Absolute 0.0  0.0 - 0.1 K/uL   Smear Review Criteria for review not met    COMPREHENSIVE METABOLIC PANEL   Collection Time    04/03/12  2:05 PM      Result Value Range   Sodium 141  135 - 145 mEq/L   Potassium 4.6  3.5 - 5.3 mEq/L   Chloride 104  96 - 112 mEq/L   CO2 30  19 - 32 mEq/L   Glucose, Bld 82  70 - 99 mg/dL   BUN 6  6 - 23 mg/dL   Creat 7.82  9.56 - 2.13 mg/dL   Total Bilirubin 0.4  0.3 - 1.2 mg/dL   Alkaline Phosphatase 70  39 - 117 U/L   AST 26  0 - 37 U/L   ALT 22  0 - 35 U/L   Total Protein 6.7  6.0  - 8.3 g/dL   Albumin 3.9  3.5 - 5.2 g/dL   Calcium 9.4  8.4 - 08.6 mg/dL  HEMOGLOBIN V7Q   Collection Time    04/03/12  2:05 PM      Result Value Range   Hemoglobin A1C 5.5  <5.7 %   Mean Plasma Glucose 111  <117 mg/dL  PCP draws labs on regular basis.  No concerns are noted, except elevated cholesterol.  Assessment Axis I Schizophrenia chronic paranoid type Axis II deferred Axis III see medical history Axis IV mild to moderate  Plan/Discussion: I took her vitals.  I reviewed CC, tobacco/med/surg Hx, meds effects/ side effects, problem list, therapies and responses as well as current situation/symptoms discussed options. Continue available current effective medications. See orders and pt instructions for more details.  Medical Decision Making Problem Points:  Established problem, stable/improving (1), New problem, with no additional work-up planned (3), Review of last therapy session (1) and Review of psycho-social stressors (1) Data Points:  Review or order clinical lab tests (1) Review of medication regiment & side effects (2) Review of new medications or change in dosage (2)  I certify that outpatient services furnished can reasonably be expected to improve the patient's condition.   Orson Aloe, MD, [facility]

## 2013-03-11 ENCOUNTER — Encounter (HOSPITAL_COMMUNITY): Payer: Self-pay | Admitting: Psychiatry

## 2013-03-12 ENCOUNTER — Other Ambulatory Visit (HOSPITAL_COMMUNITY): Payer: Self-pay | Admitting: Psychiatry

## 2013-03-12 DIAGNOSIS — F202 Catatonic schizophrenia: Secondary | ICD-10-CM

## 2013-03-12 MED ORDER — PERPHENAZINE 8 MG PO TABS: 20.0000 mg | ORAL_TABLET | Freq: Two times a day (BID) | ORAL | Status: DC

## 2013-03-20 ENCOUNTER — Ambulatory Visit: Payer: Self-pay | Admitting: Orthopedic Surgery

## 2013-03-21 NOTE — Telephone Encounter (Signed)
Erroneous encounter

## 2013-03-26 ENCOUNTER — Encounter: Payer: Self-pay | Admitting: Orthopedic Surgery

## 2013-03-26 ENCOUNTER — Ambulatory Visit (INDEPENDENT_AMBULATORY_CARE_PROVIDER_SITE_OTHER): Payer: Medicare Other | Admitting: Orthopedic Surgery

## 2013-03-26 VITALS — BP 122/84 | Ht 67.0 in | Wt 235.0 lb

## 2013-03-26 DIAGNOSIS — M5430 Sciatica, unspecified side: Secondary | ICD-10-CM

## 2013-03-26 DIAGNOSIS — M5432 Sciatica, left side: Secondary | ICD-10-CM

## 2013-03-26 NOTE — Patient Instructions (Addendum)
You have received a steroid shot. 15% of patients experience increased pain at the injection site with in the next 24 hours. This is best treated with ice and tylenol extra strength 2 tabs every 8 hours. If you are still having pain please call the office.    

## 2013-03-26 NOTE — Progress Notes (Signed)
Patient ID: Sherry Navarro, female   DOB: 10/23/1941, 72 y.o.   MRN: [id number] Chief Complaint  Patient presents with  . Follow-up    3 month left hip follow up with injection   BP 122/84  Ht 5\' 7"  (1.702 m)  Wt 235 lb (106.595 kg)  BMI 36.8 kg/m2  Procedure   Inject left hip and shot left gluteal area IM Lidocaine 1%  40 mg depomedrol  Consent verbally, timeout completed injection performed without complication  Followup 3 months

## 2013-04-02 ENCOUNTER — Ambulatory Visit (HOSPITAL_COMMUNITY): Payer: Self-pay | Admitting: Psychiatry

## 2013-04-12 ENCOUNTER — Encounter (HOSPITAL_COMMUNITY): Payer: Self-pay | Admitting: Psychiatry

## 2013-04-12 ENCOUNTER — Ambulatory Visit (INDEPENDENT_AMBULATORY_CARE_PROVIDER_SITE_OTHER): Payer: Medicare Other | Admitting: Psychiatry

## 2013-04-12 VITALS — BP 130/94 | Ht 67.5 in | Wt 232.8 lb

## 2013-04-12 DIAGNOSIS — G2119 Other drug induced secondary parkinsonism: Secondary | ICD-10-CM | POA: Insufficient documentation

## 2013-04-12 DIAGNOSIS — F202 Catatonic schizophrenia: Secondary | ICD-10-CM

## 2013-04-12 DIAGNOSIS — F209 Schizophrenia, unspecified: Secondary | ICD-10-CM

## 2013-04-12 DIAGNOSIS — F200 Paranoid schizophrenia: Secondary | ICD-10-CM

## 2013-04-12 MED ORDER — PERPHENAZINE 8 MG PO TABS: 20.0000 mg | ORAL_TABLET | Freq: Two times a day (BID) | ORAL | Status: DC

## 2013-04-12 MED ORDER — PROPRANOLOL HCL 10 MG PO TABS: 10.0000 mg | ORAL_TABLET | Freq: Three times a day (TID) | ORAL | Status: DC

## 2013-04-12 NOTE — Progress Notes (Signed)
[facility] [address] Progress Note Sherry Navarro MRN: [id number] DOB: Jul 09, 1941 Age: 72 y.o.  Date: 04/12/2013 Start Time: 2:15 PM End Time: 2:35 PM  Chief Complaint: Chief Complaint  Patient presents with  . Other  . Follow-up  . Medication Refill   Subjective: "I'm doing pretty good". Depression 9/10 and Anxiety 9/10, where 1 is the best and 10 is the worst.  Pain is 9/10 in her back.  History of present illness Patient is 72 year old Caucasian female who came for her followup appointment.  Pt reports that she is compliant with the psychotropic medications with good benefit and some side effects.  She still shakes like she did with the Navane.  She has noted no benefit from doubling up the Cogentin for it.  She has not taken Vitamin E for this.  She has a slight tremor, but no cog wheeling or dyskinseia of the tongue.  NEW PROBLEM: shakes possibly related to psychotropic meds  Current psychiatric medication Navane 10 mg at bedtime   Vitals: BP 130/94  Ht 5' 7.5" (1.715 m)  Wt 232 lb 12.8 oz (105.597 kg)  BMI 35.9 kg/m2  Allergies: Allergies  Allergen Reactions  . Thorazine (Chlorpromazine) Other (See Comments)    seizures   Medical History: Past Medical History  Diagnosis Date  . High cholesterol   . Osteoarthritis   . Thyroid disorder   . AC (acromioclavicular) joint bone spurs     on hip joints   . Hiatal hernia   . Schizophrenia   . Seizures   The patient has history of hyperlipidemia, Oster arthritis, hypothyroidism and hiatal hernia.  Her primary care physician is Dr. Lorrene Reid.  Surgical History: Past Surgical History  Procedure Laterality Date  . 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 Romeo Apple    . Rt knee replacement not by dr. Romeo Apple    . Cholecystectomy     Family History: family history includes Depression in her other.  There is no history of Schizophrenia, and ADD / ADHD, and Alcohol abuse, and Drug abuse, and Anxiety disorder, and  Bipolar disorder, and Dementia, and OCD, and Paranoid behavior, and Seizures, and Sexual abuse, and Physical abuse, . Reviewed and nothing is new today.  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  She has been admitted at least 3 times for psychosis and delusional behavior.  She is been stable on her current psychiatric medication for a long time and is scared to change them.  Psychosocial history Patient lives with her sister.  She has one son who lives in [place].  Mental status examination Patient is casually dressed and fairly groomed. She is elderly woman who appears to be in her stated age.  She maintained fair eye contact.  She is tremulous.  She maintained fair eye contact. Her speech is slow but clear and coherent. She denies any active or passive suicidal thinking or homicidal thinking. She described her mood is okay and her affect is mood congruent. There no psychotic symptoms present at this time. There no delusion or hallucination at this time. Her attention and concentration is fair. She's alert and oriented x3. She denies any auditory or visual hallucination. There are some tremors present but they are chronic in nature. Her insight judgment and impulse control is okay.  Lab Results:  No results found for this or any previous visit (from the past 8736 hour(s)).PCP draws labs on regular basis.  No concerns are noted, except  elevated cholesterol.  Assessment Axis I Schizophrenia chronic paranoid type Axis II deferred Axis III see medical history Axis IV mild to moderate  Plan/Discussion: I took her vitals.  I reviewed CC, tobacco/med/surg Hx, meds effects/ side effects, problem list, therapies and responses as well as current situation/symptoms discussed options. Continue available current effective medications add Inderal for shaking possibly related to psychotropic meds. See orders and pt instructions for more  details.  MEDICATIONS this encounter: Meds ordered this encounter  Medications  . perphenazine (TRILAFON) 8 MG tablet    Sig: Take 2.5 tablets (20 mg total) by mouth 2 (two) times daily.    Dispense:  75 tablet    Refill:  0  . propranolol (INDERAL) 10 MG tablet    Sig: Take 1 tablet (10 mg total) by mouth 3 (three) times daily. TAKE THREE TIMES A DAY For shakes    Dispense:  90 tablet    Refill:  3   Medical Decision Making Problem Points:  Established problem, stable/improving (1), New problem, with no additional work-up planned (3), Review of last therapy session (1) and Review of psycho-social stressors (1) Data Points:  Review or order clinical lab tests (1) Review of medication regiment & side effects (2) Review of new medications or change in dosage (2)  I certify that outpatient services furnished can reasonably be expected to improve the patient's condition.   Orson Aloe, MD, [facility]

## 2013-04-12 NOTE — Patient Instructions (Addendum)
Try the Inderal for the shakes.  Call me back if you like how it works and want to try an even higher dose of the Inderal that is ONCE a day  Call if problems or concerns.

## 2013-04-15 ENCOUNTER — Telehealth (HOSPITAL_COMMUNITY): Payer: Self-pay | Admitting: Psychiatry

## 2013-04-15 DIAGNOSIS — F202 Catatonic schizophrenia: Secondary | ICD-10-CM

## 2013-04-15 MED ORDER — PERPHENAZINE 8 MG PO TABS: 20.0000 mg | ORAL_TABLET | Freq: Two times a day (BID) | ORAL | Status: DC

## 2013-04-15 NOTE — Telephone Encounter (Signed)
Cancelled Inderal  Wrote script for Perphenazine

## 2013-04-16 ENCOUNTER — Telehealth (HOSPITAL_COMMUNITY): Payer: Self-pay | Admitting: Psychiatry

## 2013-04-16 DIAGNOSIS — F202 Catatonic schizophrenia: Secondary | ICD-10-CM

## 2013-04-16 NOTE — Telephone Encounter (Signed)
Spoke w pt  Have cancelled the Inderal.  Called pharmacy and there is one fill and two refills of the Perphenazine on record for her.

## 2013-06-27 ENCOUNTER — Ambulatory Visit: Payer: Self-pay | Admitting: Orthopedic Surgery

## 2013-07-09 ENCOUNTER — Ambulatory Visit (INDEPENDENT_AMBULATORY_CARE_PROVIDER_SITE_OTHER): Payer: Medicare Other | Admitting: Psychiatry

## 2013-07-09 ENCOUNTER — Encounter (HOSPITAL_COMMUNITY): Payer: Self-pay | Admitting: Psychiatry

## 2013-07-09 ENCOUNTER — Ambulatory Visit (HOSPITAL_COMMUNITY): Payer: Self-pay | Admitting: Psychiatry

## 2013-07-09 VITALS — BP 150/90 | Ht 67.0 in | Wt 231.0 lb

## 2013-07-09 DIAGNOSIS — F209 Schizophrenia, unspecified: Secondary | ICD-10-CM

## 2013-07-09 DIAGNOSIS — F200 Paranoid schizophrenia: Secondary | ICD-10-CM

## 2013-07-09 MED ORDER — QUETIAPINE FUMARATE 50 MG PO TABS: 50.0000 mg | ORAL_TABLET | Freq: Every day | ORAL | Status: DC

## 2013-07-09 NOTE — Progress Notes (Signed)
Patient ID: Sherry Navarro, female   DOB: [birth year], 72 y.o.   MRN: [id number] [facility] [address] Progress Note Sherry Navarro MRN: [id number] DOB: 01-30-1941 Age: 72 y.o.  Date: 07/09/2013 Start Time: 2:15 PM End Time: 2:35 PM  Chief Complaint: Chief Complaint  Patient presents with  . Schizophrenia  . Medication Refill  . Follow-up   Subjective: "I'm doing pretty good".   This patient is a 72 year old widowed white female who lives with her sister in [place]. She used to work as a Tree surgeon  The patient states that she has had mental illness since her early 15s. She was not married to a man was running around on her and she had a breakdown and was diagnosed with schizophrenia. We don't have any documentation of this. She was on Navane for years but it was no longer being manufactured to several months ago she was put on perphenazine 16 mg per day. She has terrible shaking in her hands and some in her feet which is probably secondary to these neuroleptic medications. She claims she had this shaking even before she was on medicine but that it's difficult to believe. She claims that she tries to get off these medicines she gets terrible headaches but I think we need to try given her shakiness. She claims she's tried Inderal Cogentin and benzodiazepines but nothing has helped.  Currently she has no paranoia auditory or visual hallucinations depression or suicidal ideation. She her sister go shopping every day and ought to be and she seems happy with her current lifestyle per  Current psychiatric medication Perphenazine 16 mg each bedtime  Vitals: BP 150/90  Ht 5\' 7"  (1.702 m)  Wt 231 lb (104.781 kg)  BMI 36.17 kg/m2  Allergies: Allergies  Allergen Reactions  . Thorazine [Chlorpromazine] Other (See Comments)    seizures   Medical History: Past Medical History  Diagnosis Date  . High cholesterol   . Osteoarthritis   . Thyroid disorder   . AC (acromioclavicular)  joint bone spurs     on hip joints   . Hiatal hernia   . Schizophrenia   . Seizures   The patient has history of hyperlipidemia, Oster arthritis, hypothyroidism and hiatal hernia.  Her primary care physician is Dr. Lorrene Reid.  Surgical History: Past Surgical History  Procedure Laterality Date  . 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 Romeo Apple    . Rt knee replacement not by dr. Romeo Apple    . Cholecystectomy     Family History: family history includes Depression in her other. There is no history of Schizophrenia, ADD / ADHD, Alcohol abuse, Drug abuse, Anxiety disorder, Bipolar disorder, Dementia, OCD, Paranoid behavior, Seizures, Sexual abuse, or Physical abuse. Reviewed and nothing is new today.  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  She has been admitted at least 3 times for psychosis and delusional behavior.  She is been stable on her current psychiatric medication for a long time and is scared to change them.  Psychosocial history Patient lives with her sister.  She has one son who lives in [place].  Mental status examination Patient is casually dressed and fairly groomed. She is elderly woman who appears to be in her stated age.  She maintained fair eye contact.  She is tremulous.  She maintained fair eye contact. Her speech is slow but clear and coherent. She denies any active or passive suicidal thinking or homicidal thinking. She described her mood  is okay and her affect is mood congruent. There no psychotic symptoms present at this time. There no delusion or hallucination at this time. Her attention and concentration is fair. She's alert and oriented x3. She denies any auditory or visual hallucination. There are some tremors present in her hands but they are chronic in nature. Her insight judgment and impulse control is okay.  Lab Results:  No results found for this or any previous visit (from the past 8736 hour(s)).PCP draws  labs on regular basis.  No concerns are noted, except elevated cholesterol.  Assessment Axis I Schizophrenia chronic paranoid type Axis II deferred Axis III see medical history Axis IV mild to moderate  Plan/Discussion: I took her vitals.  I reviewed CC, tobacco/med/surg Hx, meds effects/ side effects, problem list, therapies and responses as well as current situation/symptoms discussed options. She will discontinue perphenazine and start Seroquel 50 mg each bedtime. She'll call me if she has difficulty making a switch. She'll return in 2 months at her request. See orders and pt instructions for more details.  MEDICATIONS this encounter: Meds ordered this encounter  Medications  . QUEtiapine (SEROQUEL) 50 MG tablet    Sig: Take 1 tablet (50 mg total) by mouth at bedtime.    Dispense:  30 tablet    Refill:  2   Medical Decision Making Problem Points:  Established problem, stable/improving (1), New problem, with no additional work-up planned (3), Review of last therapy session (1) and Review of psycho-social stressors (1) Data Points:  Review or order clinical lab tests (1) Review of medication regiment & side effects (2) Review of new medications or change in dosage (2)  I certify that outpatient services furnished can reasonably be expected to improve the patient's condition.   Diannia Ruder, MD, [facility]

## 2013-07-10 ENCOUNTER — Telehealth (HOSPITAL_COMMUNITY): Payer: Self-pay | Admitting: Psychiatry

## 2013-07-10 NOTE — Telephone Encounter (Signed)
noted 

## 2013-07-12 ENCOUNTER — Telehealth (HOSPITAL_COMMUNITY): Payer: Self-pay | Admitting: Psychiatry

## 2013-07-15 NOTE — Telephone Encounter (Signed)
Called 11:50 am, no answer

## 2013-07-16 ENCOUNTER — Ambulatory Visit (INDEPENDENT_AMBULATORY_CARE_PROVIDER_SITE_OTHER): Payer: Medicare Other | Admitting: Orthopedic Surgery

## 2013-07-16 ENCOUNTER — Other Ambulatory Visit (HOSPITAL_COMMUNITY): Payer: Self-pay | Admitting: Psychiatry

## 2013-07-16 ENCOUNTER — Telehealth (HOSPITAL_COMMUNITY): Payer: Self-pay | Admitting: Psychiatry

## 2013-07-16 ENCOUNTER — Encounter: Payer: Self-pay | Admitting: Orthopedic Surgery

## 2013-07-16 VITALS — BP 143/72 | Ht 67.0 in | Wt 235.0 lb

## 2013-07-16 DIAGNOSIS — M25559 Pain in unspecified hip: Secondary | ICD-10-CM

## 2013-07-16 DIAGNOSIS — F202 Catatonic schizophrenia: Secondary | ICD-10-CM

## 2013-07-16 DIAGNOSIS — M5432 Sciatica, left side: Secondary | ICD-10-CM

## 2013-07-16 MED ORDER — PERPHENAZINE 8 MG PO TABS: 16.0000 mg | ORAL_TABLET | Freq: Two times a day (BID) | ORAL | Status: DC

## 2013-07-16 NOTE — Telephone Encounter (Signed)
Seroquel made her too dowsy, wants to go back to perphenazine.I sent the Rx to the pharmacy. Sent to pharmacy

## 2013-07-16 NOTE — Patient Instructions (Addendum)
You have received a steroid shot. 15% of patients experience increased pain at the injection site with in the next 24 hours. This is best treated with ice and tylenol extra strength 2 tabs every 8 hours. If you are still having pain please call the office.    

## 2013-07-16 NOTE — Progress Notes (Signed)
Subjective:     Patient ID: Sherry Navarro, female   DOB: 1941/05/14, 72 y.o.   MRN: [id number]  HPI Chief Complaint  Patient presents with  . Follow-up    3 month left hip follow up with injection   Chronic sciatica returns for 3 month injection left hip  Review of Systems     Objective:   Physical Exam     Assessment:     Injection left hip IM injection   Medication Depo-Medrol 40 mg Lidocaine 1% 3 cc  Verbal consent and timeout to confirm injection site  Left gluteal area we injected medication under sterile conditions. Band-Aid applied no complications     Plan:     Repeat in 3 months

## 2013-09-10 ENCOUNTER — Ambulatory Visit (INDEPENDENT_AMBULATORY_CARE_PROVIDER_SITE_OTHER): Payer: Medicare Other | Admitting: Psychiatry

## 2013-09-10 ENCOUNTER — Encounter (HOSPITAL_COMMUNITY): Payer: Self-pay | Admitting: Psychiatry

## 2013-09-10 VITALS — BP 140/88 | Ht 67.0 in | Wt 228.0 lb

## 2013-09-10 DIAGNOSIS — F200 Paranoid schizophrenia: Secondary | ICD-10-CM

## 2013-09-10 DIAGNOSIS — F209 Schizophrenia, unspecified: Secondary | ICD-10-CM

## 2013-09-10 DIAGNOSIS — F202 Catatonic schizophrenia: Secondary | ICD-10-CM

## 2013-09-10 MED ORDER — PERPHENAZINE 8 MG PO TABS: 16.0000 mg | ORAL_TABLET | Freq: Every day | ORAL | Status: DC

## 2013-09-10 NOTE — Progress Notes (Signed)
Patient ID: Sherry Navarro, female   DOB: October 17, 1941, 72 y.o.   MRN: [id number] Patient ID: Sherry Navarro, female   DOB: November 16, 1940, 72 y.o.   MRN: [id number] [facility] [address] Progress Note Sherry Navarro MRN: [id number] DOB: 04-15-1941 Age: 72 y.o.  Date: 09/10/2013 Start Time: 2:15 PM End Time: 2:35 PM  Chief Complaint: Chief Complaint  Patient presents with  . Schizophrenia  . Follow-up   Subjective: "I'm doing pretty good".   This patient is a 72 year old widowed white female who lives with her sister in [place]. She used to work as a Tree surgeon  The patient states that she has had mental illness since her early 58s. She was not married to a man was running around on her and she had a breakdown and was diagnosed with schizophrenia. We don't have any documentation of this. She was on Navane for years but it was no longer being manufactured to several months ago she was put on perphenazine 16 mg per day. She has terrible shaking in her hands and some in her feet which is probably secondary to these neuroleptic medications. She claims she had this shaking even before she was on medicine but that it's difficult to believe. She claims that she tries to get off these medicines she gets terrible headaches but I think we need to try given her shakiness. She claims she's tried Inderal Cogentin and benzodiazepines but nothing has helped.  Currently she has no paranoia auditory or visual hallucinations depression or suicidal ideation. She her sister go shopping every day and ought to be and she seems happy with her current lifestyle  The patient returns after 2 months. Last time I tried to change her from perphenazine the Seroquel because of her tremors in both hands that are severe. She did not like being on Seroquel she claims she became very agitated and irritable. She insisted on going back on the perphenazine. She's back on it now and feels good. She claims she has "learned to live  with" the tremors and the only thing she has trouble with his handwriting. Her mood is good and she's not having symptoms of paranoia or auditory hallucinations or depression. She does not want to change her medication. Anticholinergic agents like Cogentin or Benadryl have not helped the tremor   Current psychiatric medication Perphenazine 16 mg each bedtime  Vitals: BP 140/88  Ht 5\' 7"  (1.702 m)  Wt 228 lb (103.42 kg)  BMI 35.70 kg/m2  Allergies: Allergies  Allergen Reactions  . Thorazine [Chlorpromazine] Other (See Comments)    seizures   Medical History: Past Medical History  Diagnosis Date  . High cholesterol   . Osteoarthritis   . Thyroid disorder   . AC (acromioclavicular) joint bone spurs     on hip joints   . Hiatal hernia   . Schizophrenia   . Seizures   The patient has history of hyperlipidemia, Oster arthritis, hypothyroidism and hiatal hernia.  Her primary care physician is Dr. Lorrene Reid.  Surgical History: Past Surgical History  Procedure Laterality Date  . 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 Romeo Apple    . Rt knee replacement not by dr. Romeo Apple    . Cholecystectomy     Family History: family history includes Depression in her other. There is no history of Schizophrenia, ADD / ADHD, Alcohol abuse, Drug abuse, Anxiety disorder, Bipolar disorder, Dementia, OCD, Paranoid behavior, Seizures, Sexual abuse, or Physical abuse. Reviewed and nothing is new today.  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  She has been admitted at least 3 times for psychosis and delusional behavior.  She is been stable on her current psychiatric medication for a long time and is scared to change them.  Psychosocial history Patient lives with her sister.  She has one son who lives in [place].  Mental status examination Patient is casually dressed and fairly groomed. She is elderly woman who appears to be in her stated age.  She  maintained fair eye contact.  She is tremulous.  She maintained fair eye contact. Her speech is slow but clear and coherent. She denies any active or passive suicidal thinking or homicidal thinking. She described her mood is okay and her affect is mood congruent. There no psychotic symptoms present at this time. There no delusion or hallucination at this time. Her attention and concentration is fair. She's alert and oriented x3. She denies any auditory or visual hallucination. There are some tremors present in her hands but they are chronic in nature. Her insight judgment and impulse control is okay.  Lab Results:  No results found for this or any previous visit (from the past 8736 hour(s)).PCP draws labs on regular basis.  No concerns are noted, except elevated cholesterol.  Assessment Axis I Schizophrenia chronic paranoid type Axis II deferred Axis III see medical history, also add antipsychotic-induced tardive dyskinesia Axis IV mild to moderate  Plan/Discussion: I took her vitals.  I reviewed CC, tobacco/med/surg Hx, meds effects/ side effects, problem list, therapies and responses as well as current situation/symptoms discussed options. She will continue perphenazine 16 mg per day  She'll return in 3 months  See orders and pt instructions for more details.  MEDICATIONS this encounter: Meds ordered this encounter  Medications  . perphenazine (TRILAFON) 8 MG tablet    Sig: Take 2 tablets (16 mg total) by mouth at bedtime.    Dispense:  60 tablet    Refill:  2   Medical Decision Making Problem Points:  Established problem, stable/improving (1), New problem, with no additional work-up planned (3), Review of last therapy session (1) and Review of psycho-social stressors (1) Data Points:  Review or order clinical lab tests (1) Review of medication regiment & side effects (2) Review of new medications or change in dosage (2)  I certify that outpatient services furnished can reasonably be  expected to improve the patient's condition.   Diannia Ruder, MD, [facility]

## 2013-10-15 ENCOUNTER — Ambulatory Visit (INDEPENDENT_AMBULATORY_CARE_PROVIDER_SITE_OTHER): Payer: Medicare Other | Admitting: Orthopedic Surgery

## 2013-10-15 VITALS — BP 126/77 | Ht 67.0 in | Wt 235.0 lb

## 2013-10-15 DIAGNOSIS — M5430 Sciatica, unspecified side: Secondary | ICD-10-CM

## 2013-10-15 DIAGNOSIS — M25559 Pain in unspecified hip: Secondary | ICD-10-CM

## 2013-10-15 DIAGNOSIS — M5432 Sciatica, left side: Secondary | ICD-10-CM

## 2013-10-15 NOTE — Progress Notes (Signed)
Patient ID: Sherry Navarro, female   DOB: November 29, 1940, 72 y.o.   MRN: [id number]  Chief Complaint  Patient presents with  . Follow-up      3 month left hip follow up with injection     Intramuscular shot left hip  Point of maximal tenderness is related Verbal consent Timeout to confirm site as left hip near SI joint 25-gauge needle 3 cc syringe 1 cc of 40 mg of Depo-Medrol 1 cc of lidocaine 1% Ethyl chloride prep the skin along with alcohol  After sterile prep and anesthetization injection was performed no complication

## 2013-12-10 ENCOUNTER — Ambulatory Visit (INDEPENDENT_AMBULATORY_CARE_PROVIDER_SITE_OTHER): Payer: Medicare Other | Admitting: Psychiatry

## 2013-12-10 ENCOUNTER — Encounter (HOSPITAL_COMMUNITY): Payer: Self-pay | Admitting: Psychiatry

## 2013-12-10 VITALS — BP 150/84 | Ht 67.0 in | Wt 229.0 lb

## 2013-12-10 DIAGNOSIS — F209 Schizophrenia, unspecified: Secondary | ICD-10-CM

## 2013-12-10 DIAGNOSIS — F202 Catatonic schizophrenia: Secondary | ICD-10-CM

## 2013-12-10 MED ORDER — PERPHENAZINE 8 MG PO TABS: 16.0000 mg | ORAL_TABLET | Freq: Every day | ORAL | Status: DC

## 2013-12-10 NOTE — Progress Notes (Signed)
Patient ID: Sherry Navarro, female   DOB: [birth year], 73 y.o.   MRN: [id number] Patient ID: Sherry Navarro, female   DOB: August 25, 1941, 73 y.o.   MRN: [id number] Patient ID: Sherry Navarro, female   DOB: February 08, 1941, 73 y.o.   MRN: [id number] [facility] [id number] Progress Note Sherry Navarro MRN: [id number] DOB: December 20, 1940 Age: 73 y.o.  Date: 12/10/2013 Start Time: 2:15 PM End Time: 2:35 PM  Chief Complaint: Chief Complaint  Patient presents with  . Schizophrenia  . Follow-up   Subjective: "I'm doing pretty good".   This patient is a 73 year old widowed white female who lives with her sister in [place]. She used to work as a Tree surgeon  The patient states that she has had mental illness since her early 75s. She was not married to a man was running around on her and she had a breakdown and was diagnosed with schizophrenia. We don't have any documentation of this. She was on Navane for years but it was no longer being manufactured to several months ago she was put on perphenazine 16 mg per day. She has terrible shaking in her hands and some in her feet which is probably secondary to these neuroleptic medications. She claims she had this shaking even before she was on medicine but that it's difficult to believe. She claims that she tries to get off these medicines she gets terrible headaches but I think we need to try given her shakiness. She claims she's tried Inderal Cogentin and benzodiazepines but nothing has helped.  Currently she has no paranoia auditory or visual hallucinations depression or suicidal ideation. She her sister go shopping every day and ought to be and she seems happy with her current lifestyle  The patient returns after 3 months. She states that she's doing okay except she's had some chest pain and also pain going down her left arm. I told her she needs to see her primary doctor right away but she claims she's been taking fish oil and the pain has subsided. She also  claimed that she does not the money for an appointment with her primary doctor or for any lab tests I tried repeatedly to dissuade her from this line of thinking. She claims that her mental health has been stable and that she's not become depressed or had any auditory or visual hallucinations. She still has tremors and shakes but she claims that anticholinergic medications and antianxiety medications have not helped her Current psychiatric medication Perphenazine 16 mg each bedtime  Vitals: BP 150/84  Ht 5\' 7"  (1.702 m)  Wt 229 lb (103.874 kg)  BMI 35.86 kg/m2  Allergies: Allergies  Allergen Reactions  . Thorazine [Chlorpromazine] Other (See Comments)    seizures   Medical History: Past Medical History  Diagnosis Date  . High cholesterol   . Osteoarthritis   . Thyroid disorder   . AC (acromioclavicular) joint bone spurs     on hip joints   . Hiatal hernia   . Schizophrenia   . Seizures   The patient has history of hyperlipidemia, Oster arthritis, hypothyroidism and hiatal hernia.  Her primary care physician is Dr. Lorrene Reid.  Surgical History: Past Surgical History  Procedure Laterality Date  . 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 Romeo Apple    . Rt knee replacement not by dr. Romeo Apple    . Cholecystectomy     Family History: family history includes Depression in her other. There is no history of Schizophrenia, ADD / ADHD,  Alcohol abuse, Drug abuse, Anxiety disorder, Bipolar disorder, Dementia, OCD, Paranoid behavior, Seizures, Sexual abuse, or Physical abuse. Reviewed and nothing is new today.  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  She has been admitted at least 3 times for psychosis and delusional behavior.  She is been stable on her current psychiatric medication for a long time and is scared to change them.  Psychosocial history Patient lives with her sister.  She has one son who lives in [place].  Mental status  examination Patient is casually dressed and fairly groomed. She is elderly woman who appears to be in her stated age.  She maintained fair eye contact.  She is tremulous.  She maintained fair eye contact. Her speech is slow but clear and coherent. She denies any active or passive suicidal thinking or homicidal thinking. She described her mood is okay and her affect is mood congruent. There no psychotic symptoms present at this time. There no delusion or hallucination at this time. Her attention and concentration is fair. She's alert and oriented x3. She denies any auditory or visual hallucination. There are some tremors present in her hands but they are chronic in nature. Her insight judgment and impulse control is okay.  Lab Results:  No results found for this or any previous visit (from the past 8736 hour(s)).PCP draws labs on regular basis.  No concerns are noted, except elevated cholesterol.  Assessment Axis I Schizophrenia chronic paranoid type Axis II deferred Axis III see medical history, also add antipsychotic-induced tardive dyskinesia Axis IV mild to moderate  Plan/Discussion: I took her vitals.  I reviewed CC, tobacco/med/surg Hx, meds effects/ side effects, problem list, therapies and responses as well as current situation/symptoms discussed options. She was strongly urged to see her primary care physician as soon as possible for an evaluation and  EKG She will continue perphenazine 16 mg per day  She'll return in 4 months  See orders and pt instructions for more details.  MEDICATIONS this encounter: Meds ordered this encounter  Medications  . perphenazine (TRILAFON) 8 MG tablet    Sig: Take 2 tablets (16 mg total) by mouth at bedtime.    Dispense:  60 tablet    Refill:  3   Medical Decision Making Problem Points:  Established problem, stable/improving (1), New problem, with no additional work-up planned (3), Review of last therapy session (1) and Review of psycho-social  stressors (1) Data Points:  Review or order clinical lab tests (1) Review of medication regiment & side effects (2) Review of new medications or change in dosage (2)  I certify that outpatient services furnished can reasonably be expected to improve the patient's condition.   Diannia RuderOSS, Patrecia Veiga, MD

## 2014-01-14 ENCOUNTER — Ambulatory Visit (INDEPENDENT_AMBULATORY_CARE_PROVIDER_SITE_OTHER): Payer: Medicare Other | Admitting: Orthopedic Surgery

## 2014-01-14 VITALS — BP 113/67 | Ht 67.0 in | Wt 229.0 lb

## 2014-01-14 DIAGNOSIS — M76899 Other specified enthesopathies of unspecified lower limb, excluding foot: Secondary | ICD-10-CM

## 2014-01-14 DIAGNOSIS — M25559 Pain in unspecified hip: Secondary | ICD-10-CM

## 2014-01-14 DIAGNOSIS — M7070 Other bursitis of hip, unspecified hip: Secondary | ICD-10-CM

## 2014-01-14 NOTE — Patient Instructions (Signed)
Activity as tolerated

## 2014-01-14 NOTE — Progress Notes (Signed)
Patient ID: Sherry Navarro, female   DOB: 12/28/1940, 73 y.o.   MRN: [id number]  Chief Complaint  Patient presents with  . Follow-up    injection left hip requested     Left Hip Injection Verbal consent was obtained  Timeout procedure was completed  Alcohol was used as a prep with ethyl chloride as an anesthetizing agent  Using a 25-gauge  needle the greater trochanter bursa was entered and injected with Depo-Medrol 40 mg.  And 3 cc of 1% lidocaine   Tolerated with no complications

## 2014-04-09 ENCOUNTER — Encounter (HOSPITAL_COMMUNITY): Payer: Self-pay | Admitting: Psychiatry

## 2014-04-09 ENCOUNTER — Ambulatory Visit (INDEPENDENT_AMBULATORY_CARE_PROVIDER_SITE_OTHER): Payer: Medicare Other | Admitting: Psychiatry

## 2014-04-09 VITALS — BP 130/80 | Ht 67.0 in | Wt 233.0 lb

## 2014-04-09 DIAGNOSIS — F209 Schizophrenia, unspecified: Secondary | ICD-10-CM

## 2014-04-09 DIAGNOSIS — F200 Paranoid schizophrenia: Secondary | ICD-10-CM

## 2014-04-09 DIAGNOSIS — F202 Catatonic schizophrenia: Secondary | ICD-10-CM

## 2014-04-09 MED ORDER — PERPHENAZINE 8 MG PO TABS: 16.0000 mg | ORAL_TABLET | Freq: Every day | ORAL | Status: DC

## 2014-04-09 NOTE — Progress Notes (Signed)
Patient ID: Sherry Navarro, female   DOB: 1941-03-24, 73 y.o.   MRN: [id number] Patient ID: Sherry Navarro, female   DOB: 1941-05-25, 73 y.o.   MRN: [id number] Patient ID: Sherry Navarro, female   DOB: 12/29/1940, 73 y.o.   MRN: [id number] Patient ID: Sherry Navarro, female   DOB: 1941/02/28, 73 y.o.   MRN: [id number] [facility] [id number] Progress Note Sherry Navarro MRN: [id number] DOB: July 24, 1941 Age: 73 y.o.  Date: 04/09/2014 Start Time: 2:15 PM End Time: 2:35 PM  Chief Complaint: Chief Complaint  Patient presents with  . Schizophrenia  . Follow-up   Subjective: "I'm doing pretty good".   This patient is a 73 year old widowed white female who lives with her sister in [place]. She used to work as a Tree surgeon  The patient states that she has had mental illness since her early 67s. She was not married to a man was running around on her and she had a breakdown and was diagnosed with schizophrenia. We don't have any documentation of this. She was on Navane for years but it was no longer being manufactured to several months ago she was put on perphenazine 16 mg per day. She has terrible shaking in her hands and some in her feet which is probably secondary to these neuroleptic medications. She claims she had this shaking even before she was on medicine but that it's difficult to believe. She claims that she tries to get off these medicines she gets terrible headaches but I think we need to try given her shakiness. She claims she's tried Inderal Cogentin and benzodiazepines but nothing has helped.  Currently she has no paranoia auditory or visual hallucinations depression or suicidal ideation. She her sister go shopping every day and ought to be and she seems happy with her current lifestyle  The patient returns after 4 months. She states that she's doing okay except that she feels tired all the time. She has hypothyroidism and is on thyroid replacement per Dr. Delbert Harness. I suggested she come  about the fatigue but he is checked her thyroid levels and they're normal. Her mood is been good and she denies any auditory visual summations or suicidal ideation. She stays active running parents and doing housework. She still has shaking in her hands but claims that nothing helps and she doesn't want to get off the perphenazine. She is sleeping well at night Current psychiatric medication Perphenazine 16 mg each bedtime  Vitals: BP 130/80  Ht 5\' 7"  (1.702 m)  Wt 233 lb (105.688 kg)  BMI 36.48 kg/m2  Allergies: Allergies  Allergen Reactions  . Thorazine [Chlorpromazine] Other (See Comments)    seizures   Medical History: Past Medical History  Diagnosis Date  . High cholesterol   . Osteoarthritis   . Thyroid disorder   . AC (acromioclavicular) joint bone spurs     on hip joints   . Hiatal hernia   . Schizophrenia   . Seizures   The patient has history of hyperlipidemia, Oster arthritis, hypothyroidism and hiatal hernia.  Her primary care physician is Dr. Lorrene Reid.  Surgical History: Past Surgical History  Procedure Laterality Date  . 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 Romeo Apple    . Rt knee replacement not by dr. Romeo Apple    . Cholecystectomy     Family History: family history includes Depression in her other. There is no history of Schizophrenia, ADD / ADHD, Alcohol abuse, Drug abuse, Anxiety disorder, Bipolar disorder, Dementia,  OCD, Paranoid behavior, Seizures, Sexual abuse, or Physical abuse. Reviewed and nothing is new today.  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  She has been admitted at least 3 times for psychosis and delusional behavior.  She is been stable on her current psychiatric medication for a long time and is scared to change them.  Psychosocial history Patient lives with her sister.  She has one son who lives in [place].  Mental status examination Patient is casually dressed and fairly groomed.  She is elderly woman who appears to be in her stated age.  She maintained fair eye contact.  She is tremulous but a little bit less than last visit  She maintained fair eye contact. Her speech is slow but clear and coherent. She denies any active or passive suicidal thinking or homicidal thinking. She described her mood is okay and her affect is mood congruent. There no psychotic symptoms present at this time. There no delusion or hallucination at this time. Her attention and concentration is fair. She's alert and oriented x3. She denies any auditory or visual hallucination. There are some tremors present in her hands but they are chronic in nature. Her insight judgment and impulse control is okay.  Lab Results:  No results found for this or any previous visit (from the past 8736 hour(s)).PCP draws labs on regular basis.  No concerns are noted, except elevated cholesterol.  Assessment Axis I Schizophrenia chronic paranoid type Axis II deferred Axis III see medical history, also add antipsychotic-induced tardive dyskinesia Axis IV mild to moderate  Plan/Discussion: I took her vitals.  I reviewed CC, tobacco/med/surg Hx, meds effects/ side effects, problem list, therapies and responses as well as current situation/symptoms discussed options.  She will continue perphenazine 16 mg per day  She'll return in 4 months  See orders and pt instructions for more details.  MEDICATIONS this encounter: Meds ordered this encounter  Medications  . perphenazine (TRILAFON) 8 MG tablet    Sig: Take 2 tablets (16 mg total) by mouth at bedtime.    Dispense:  60 tablet    Refill:  3   Medical Decision Making Problem Points:  Established problem, stable/improving (1), New problem, with no additional work-up planned (3), Review of last therapy session (1) and Review of psycho-social stressors (1) Data Points:  Review or order clinical lab tests (1) Review of medication regiment & side effects (2) Review of new  medications or change in dosage (2)  I certify that outpatient services furnished can reasonably be expected to improve the patient's condition.   Diannia RuderOSS, Esteban Kobashigawa, MD

## 2014-04-15 ENCOUNTER — Ambulatory Visit (INDEPENDENT_AMBULATORY_CARE_PROVIDER_SITE_OTHER): Payer: Medicare Other | Admitting: Orthopedic Surgery

## 2014-04-15 VITALS — BP 134/69 | Ht 67.0 in | Wt 236.0 lb

## 2014-04-15 DIAGNOSIS — M76899 Other specified enthesopathies of unspecified lower limb, excluding foot: Secondary | ICD-10-CM

## 2014-04-15 DIAGNOSIS — M7070 Other bursitis of hip, unspecified hip: Secondary | ICD-10-CM

## 2014-04-15 DIAGNOSIS — M25559 Pain in unspecified hip: Secondary | ICD-10-CM

## 2014-04-15 NOTE — Progress Notes (Signed)
Patient ID: Sherry Navarro, female   DOB: 10/01/1941, 73 y.o.   MRN: [id number]   Hip Injection Verbal consent was obtained  Timeout procedure was completed  Alcohol was used as a prep with ethyl chloride as an anesthetizing agent  Using a 25 - gauge spinal needle theleft buttock area  was entered and injected with Depo-Medrol 40 mg.  We also diluted out with 3 cc 1% plain lidocaine

## 2014-07-22 ENCOUNTER — Ambulatory Visit (INDEPENDENT_AMBULATORY_CARE_PROVIDER_SITE_OTHER): Payer: Medicare Other | Admitting: Orthopedic Surgery

## 2014-07-22 VITALS — BP 128/79 | Ht 67.0 in | Wt 218.8 lb

## 2014-07-22 DIAGNOSIS — M5432 Sciatica, left side: Secondary | ICD-10-CM

## 2014-07-22 DIAGNOSIS — M76899 Other specified enthesopathies of unspecified lower limb, excluding foot: Secondary | ICD-10-CM

## 2014-07-22 DIAGNOSIS — M5430 Sciatica, unspecified side: Secondary | ICD-10-CM

## 2014-07-22 DIAGNOSIS — M7072 Other bursitis of hip, left hip: Secondary | ICD-10-CM

## 2014-07-22 NOTE — Progress Notes (Signed)
Patient ID: Sherry Navarro, female   DOB: 1941-03-07, 73 y.o.   MRN: [id number]

## 2014-07-22 NOTE — Progress Notes (Signed)
Chief Complaint  Patient presents with  . Follow-up    3 month follow up left hip, repeat injection    Procedure note  Injection  Verbal consent was obtained to inject the  Left hip IM   Timeout procedure was completed to confirm injection site  Diagnosis chronic pain   Medications used Depo-Medrol 40 mg 1 cc Lidocaine 1% plain 3 cc  Anesthesia was provided by ethyl chloride spray  Prep was performed with alcohol  Technique of injection  im injection left hip    No complications were noted

## 2014-08-05 ENCOUNTER — Encounter (HOSPITAL_COMMUNITY): Payer: Self-pay | Admitting: Psychiatry

## 2014-08-05 ENCOUNTER — Ambulatory Visit (INDEPENDENT_AMBULATORY_CARE_PROVIDER_SITE_OTHER): Payer: Medicare Other | Admitting: Psychiatry

## 2014-08-05 VITALS — BP 122/86 | HR 98 | Ht 67.0 in | Wt 216.2 lb

## 2014-08-05 DIAGNOSIS — F203 Undifferentiated schizophrenia: Secondary | ICD-10-CM

## 2014-08-05 DIAGNOSIS — F200 Paranoid schizophrenia: Secondary | ICD-10-CM

## 2014-08-05 DIAGNOSIS — F202 Catatonic schizophrenia: Secondary | ICD-10-CM

## 2014-08-05 MED ORDER — PERPHENAZINE 8 MG PO TABS: 16.0000 mg | ORAL_TABLET | Freq: Every day | ORAL | Status: DC

## 2014-08-05 NOTE — Progress Notes (Signed)
Patient ID: Sherry Navarro, female   DOB: 03-Dec-1940, 73 y.o.   MRN: [id number] Patient ID: Sherry Navarro, female   DOB: [birth year], 73 y.o.   MRN: [id number] Patient ID: Sherry Navarro, female   DOB: 02/17/1941, 73 y.o.   MRN: [id number] Patient ID: Sherry Navarro, female   DOB: 1940-11-04, 73 y.o.   MRN: [id number] Patient ID: Sherry Navarro, female   DOB: Jul 10, 1941, 73 y.o.   MRN: [id number] [facility] [address] Progress Note Sherry Navarro MRN: [id number] DOB: June 01, 1941 Age: 73 y.o.  Date: 08/05/2014 Start Time: 2:15 PM End Time: 2:35 PM  Chief Complaint: Chief Complaint  Patient presents with  . Schizophrenia  . Follow-up   Subjective: "I'm doing pretty good".   This patient is a 73 year old widowed white female who lives with her sister in [place]. She used to work as a Tree surgeon  The patient states that she has had mental illness since her early 67s. She was not married to a man was running around on her and she had a breakdown and was diagnosed with schizophrenia. We don't have any documentation of this. She was on Navane for years but it was no longer being manufactured to several months ago she was put on perphenazine 16 mg per day. She has terrible shaking in her hands and some in her feet which is probably secondary to these neuroleptic medications. She claims she had this shaking even before she was on medicine but that it's difficult to believe. She claims that she tries to get off these medicines she gets terrible headaches but I think we need to try given her shakiness. She claims she's tried Inderal Cogentin and benzodiazepines but nothing has helped.  Currently she has no paranoia auditory or visual hallucinations depression or suicidal ideation. She her sister go shopping every day and ought to be and she seems happy with her current lifestyle  The patient returns after 3 months. Her hands are still very shaky. She realizes this cannot be helped at this point because  it's been years of being on having. She's very focused on religious delusions today. She claims she's been in heaven and  is the actual mother of Jesus. She claims that she truly believes this. She has not brought it up before. She claims these thoughts don't bother her and that she hears the voice of God speaking to her and it's very comforting. She's still doing all her daily tasks and is compliant with her medications and helps take care of her sister. Current psychiatric medication Perphenazine 16 mg each bedtime  Vitals: BP 122/86  Pulse 98  Ht 5\' 7"  (1.702 m)  Wt 216 lb 3.2 oz (98.068 kg)  BMI 33.85 kg/m2  Allergies: Allergies  Allergen Reactions  . Thorazine [Chlorpromazine] Other (See Comments)    seizures   Medical History: Past Medical History  Diagnosis Date  . High cholesterol   . Osteoarthritis   . Thyroid disorder   . AC (acromioclavicular) joint bone spurs     on hip joints   . Hiatal hernia   . Schizophrenia   . Seizures   The patient has history of hyperlipidemia, Oster arthritis, hypothyroidism and hiatal hernia.  Her primary care physician is Dr. Lorrene Reid.  Surgical History: Past Surgical History  Procedure Laterality Date  . 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 Romeo Apple    . Rt knee replacement not by dr. Romeo Apple    . Cholecystectomy  Family History: family history includes Depression in her other. There is no history of Schizophrenia, ADD / ADHD, Alcohol abuse, Drug abuse, Anxiety disorder, Bipolar disorder, Dementia, OCD, Paranoid behavior, Seizures, Sexual abuse, or Physical abuse. Reviewed and nothing is new today.  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  She has been admitted at least 3 times for psychosis and delusional behavior.  She is been stable on her current psychiatric medication for a long time and is scared to change them.  Psychosocial history Patient lives with her sister.  She  has one son who lives in [place].  Mental status examination Patient is casually dressed and fairly groomed. She is elderly woman who appears to be in her stated age.  She maintained fair eye contact.  She is tremulous but a little bit less than last visit  She maintained fair eye contact. Her speech is slow but clear and coherent. She denies any active or passive suicidal thinking or homicidal thinking. She described her mood is okay and her affect is mood congruent. She claims that she is the mother of God and hears the voice of God but none of this really bothers her Her attention and concentration is fair. She's alert and oriented x3.  There are some tremors present in her hands but they are chronic in nature. Her insight judgment and impulse control is okay.  Lab Results:  No results found for this or any previous visit (from the past 8736 hour(s)).PCP draws labs on regular basis.  No concerns are noted, except elevated cholesterol.  Assessment Axis I Schizophrenia chronic paranoid type Axis II deferred Axis III see medical history, also add antipsychotic-induced tardive dyskinesia Axis IV mild to moderate  Plan/Discussion: I took her vitals.  I reviewed CC, tobacco/med/surg Hx, meds effects/ side effects, problem list, therapies and responses as well as current situation/symptoms discussed options. Her religious delusions seem to be part of her baseline She will continue perphenazine 16 mg per day  She'll return in 3 months  See orders and pt instructions for more details.  MEDICATIONS this encounter: Meds ordered this encounter  Medications  . Ibuprofen (ADVIL PO)    Sig: Take 200 mg by mouth as needed.  Marland Kitchen. perphenazine (TRILAFON) 8 MG tablet    Sig: Take 2 tablets (16 mg total) by mouth at bedtime.    Dispense:  60 tablet    Refill:  3   Medical Decision Making Problem Points:  Established problem, stable/improving (1), New problem, with no additional work-up planned (3), Review  of last therapy session (1) and Review of psycho-social stressors (1) Data Points:  Review or order clinical lab tests (1) Review of medication regiment & side effects (2) Review of new medications or change in dosage (2)  I certify that outpatient services furnished can reasonably be expected to improve the patient's condition.   Diannia RuderOSS, Katriana Dortch, MD

## 2014-10-21 ENCOUNTER — Ambulatory Visit (INDEPENDENT_AMBULATORY_CARE_PROVIDER_SITE_OTHER): Payer: Medicare Other | Admitting: Orthopedic Surgery

## 2014-10-21 ENCOUNTER — Encounter: Payer: Self-pay | Admitting: Orthopedic Surgery

## 2014-10-21 VITALS — BP 127/68 | Ht 67.0 in | Wt 224.2 lb

## 2014-10-21 DIAGNOSIS — M7072 Other bursitis of hip, left hip: Secondary | ICD-10-CM

## 2014-10-21 NOTE — Progress Notes (Signed)
Patient ID: Sherry Navarro, female   DOB: 09/14/1941, 73 y.o.   MRN: [id number]   Chief Complaint  Patient presents with  . Follow-up    3 month follow up left hip and repeat injection    Procedure note  Injection  Verbal consent was obtained to inject the  Left hip IM   Timeout procedure was completed to confirm injection site  Diagnosis chronic pain   Medications used Depo-Medrol 40 mg 1 cc Lidocaine 1% plain 3 cc  Anesthesia was provided by ethyl chloride spray  Prep was performed with alcohol  Technique of injection  im injection left hip    No complications were noted

## 2014-11-04 ENCOUNTER — Ambulatory Visit (INDEPENDENT_AMBULATORY_CARE_PROVIDER_SITE_OTHER): Payer: Medicare Other | Admitting: Psychiatry

## 2014-11-04 ENCOUNTER — Encounter (HOSPITAL_COMMUNITY): Payer: Self-pay | Admitting: Psychiatry

## 2014-11-04 VITALS — BP 120/78 | Ht 67.0 in | Wt 221.0 lb

## 2014-11-04 DIAGNOSIS — F200 Paranoid schizophrenia: Secondary | ICD-10-CM

## 2014-11-04 DIAGNOSIS — F202 Catatonic schizophrenia: Secondary | ICD-10-CM

## 2014-11-04 DIAGNOSIS — F203 Undifferentiated schizophrenia: Secondary | ICD-10-CM

## 2014-11-04 MED ORDER — PERPHENAZINE 8 MG PO TABS: 16.0000 mg | ORAL_TABLET | Freq: Every day | ORAL | Status: DC

## 2014-11-04 NOTE — Progress Notes (Signed)
Patient ID: Sherry Navarro, female   DOB: 07/13/1941, 74 y.o.   MRN: [id number] Patient ID: Sherry Navarro, female   DOB: 07/13/1941, 74 y.o.   MRN: [id number] Patient ID: Sherry Navarro, female   DOB: 01/24/1941, 74 y.o.   MRN: [id number] Patient ID: Sherry Navarro, female   DOB: 08/20/1941, 74 y.o.   MRN: [id number] Patient ID: Sherry Navarro, female   DOB: 12/15/1940, 74 y.o.   MRN: [id number] Patient ID: Sherry Navarro, female   DOB: 12/01/1940, 74 y.o.   MRN: [id number] [facility] [id number] Progress Note Sherry Navarro MRN: [id number] DOB: 08/28/1941 Age: 74 y.o.  Date: 11/04/2014 Start Time: 2:15 PM End Time: 2:35 PM  Chief Complaint: Chief Complaint  Patient presents with  . Schizophrenia  . Follow-up   Subjective: "I'm doing pretty good".   This patient is a 74 year old widowed white female who lives with her sister in [place]. She used to work as a Tree surgeonbeautician  The patient states that she has had mental illness since her early 3620s. She was not married to a man was running around on her and she had a breakdown and was diagnosed with schizophrenia. We don't have any documentation of this. She was on Navane for years but it was no longer being manufactured to several months ago she was put on perphenazine 16 mg per day. She has terrible shaking in her hands and some in her feet which is probably secondary to these neuroleptic medications. She claims she had this shaking even before she was on medicine but that it's difficult to believe. She claims that she tries to get off these medicines she gets terrible headaches but I think we need to try given her shakiness. She claims she's tried Inderal Cogentin and benzodiazepines but nothing has helped.  Currently she has no paranoia auditory or visual hallucinations depression or suicidal ideation. She her sister go shopping every day and ought to be and she seems happy with her current lifestyle  The patient returns after 3 months. She still  spouts out scripture but she isn't as obsessed about knowing the mother of God, etc. She denies auditory or visual hallucinations or depression. She had her sister stay active and they get out every day. She still has significant shaking in both hands and is tried numerous medications for it and nothing really has helped. She states by the end of the day it subsides and she is able to do her writing Current psychiatric medication Perphenazine 16 mg each bedtime  Vitals: BP 120/78 mmHg  Ht 5\' 7"  (1.702 m)  Wt 221 lb (100.245 kg)  BMI 34.61 kg/m2  Allergies: Allergies  Allergen Reactions  . Thorazine [Chlorpromazine] Other (See Comments)    seizures   Medical History: Past Medical History  Diagnosis Date  . High cholesterol   . Osteoarthritis   . Thyroid disorder   . AC (acromioclavicular) joint bone spurs     on hip joints   . Hiatal hernia   . Schizophrenia   . Seizures   The patient has history of hyperlipidemia, Oster arthritis, hypothyroidism and hiatal hernia.  Her primary care physician is Dr. Lorrene Reidana Diego.  Surgical History: Past Surgical History  Procedure Laterality Date  . 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 [name]    . Rt knee replacement not by dr. [name]    . Cholecystectomy     Family History: family history includes Depression in her other. There is no  history of Schizophrenia, ADD / ADHD, Alcohol abuse, Drug abuse, Anxiety disorder, Bipolar disorder, Dementia, OCD, Paranoid behavior, Seizures, Sexual abuse, or Physical abuse. Reviewed and nothing is new today.  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  She has been admitted at least 3 times for psychosis and delusional behavior.  She is been stable on her current psychiatric medication for a long time and is scared to change them.  Psychosocial history Patient lives with her sister.  She has one son who lives in [place].  Mental status  examination Patient is casually dressed and fairly groomed. She is elderly woman who appears to be in her stated age.  She maintained fair eye contact.  She is tremulous in both hands  She maintained fair eye contact. Her speech is slow but clear and coherent. She denies any active or passive suicidal thinking or homicidal thinking. She described her mood is okay and her affect is mood congruent. She denies delusions or auditory or visual hallucinations today Her attention and concentration is fair. She's alert and oriented x3.  There are some tremors present in her hands but they are chronic in nature. Her insight judgment and impulse control is okay.  Lab Results:  No results found for this or any previous visit (from the past 8736 hour(s)).PCP draws labs on regular basis.  No concerns are noted, except elevated cholesterol.  Assessment Axis I Schizophrenia chronic paranoid type Axis II deferred Axis III see medical history, also add antipsychotic-induced tardive dyskinesia Axis IV mild to moderate  Plan/Discussion: I took her vitals.  I reviewed CC, tobacco/med/surg Hx, meds effects/ side effects, problem list, therapies and responses as well as current situation/symptoms discussed options.  She will continue perphenazine 16 mg per day  She'll return in 4 months  See orders and pt instructions for more details.  MEDICATIONS this encounter: Meds ordered this encounter  Medications  . perphenazine (TRILAFON) 8 MG tablet    Sig: Take 2 tablets (16 mg total) by mouth at bedtime.    Dispense:  60 tablet    Refill:  3   Medical Decision Making Problem Points:  Established problem, stable/improving (1), New problem, with no additional work-up planned (3), Review of last therapy session (1) and Review of psycho-social stressors (1) Data Points:  Review or order clinical lab tests (1) Review of medication regiment & side effects (2) Review of new medications or change in dosage (2)  I  certify that outpatient services furnished can reasonably be expected to improve the patient's condition.   Diannia Ruder, MD

## 2015-01-20 ENCOUNTER — Encounter: Payer: Self-pay | Admitting: Orthopedic Surgery

## 2015-01-20 ENCOUNTER — Ambulatory Visit (INDEPENDENT_AMBULATORY_CARE_PROVIDER_SITE_OTHER): Payer: PPO | Admitting: Orthopedic Surgery

## 2015-01-20 VITALS — BP 141/79 | Ht 67.0 in | Wt 220.0 lb

## 2015-01-20 DIAGNOSIS — M25552 Pain in left hip: Secondary | ICD-10-CM | POA: Diagnosis not present

## 2015-01-20 NOTE — Progress Notes (Signed)
Chief Complaint  Patient presents with  . Follow-up    3 month recheck/ repeat Left hip injection    Procedure note injection for   Left  hip pain  Verbal consent was obtained for injection of the  Left  hip  Timeout was completed to confirm the injection site  The medications used were 40 mg of Depo-Medrol and 1% lidocaine 3 cc  Anesthesia was provided by ethyl chloride and the skin was prepped with alcohol.  After cleaning the skin with alcohol a 25-gauge needle was used to inject the  Left buttock

## 2015-03-05 ENCOUNTER — Ambulatory Visit (HOSPITAL_COMMUNITY): Payer: Self-pay | Admitting: Psychiatry

## 2015-03-18 ENCOUNTER — Encounter (HOSPITAL_COMMUNITY): Payer: Self-pay | Admitting: Psychiatry

## 2015-03-18 ENCOUNTER — Ambulatory Visit (INDEPENDENT_AMBULATORY_CARE_PROVIDER_SITE_OTHER): Payer: PPO | Admitting: Psychiatry

## 2015-03-18 ENCOUNTER — Ambulatory Visit (HOSPITAL_COMMUNITY): Payer: Self-pay | Admitting: Psychiatry

## 2015-03-18 VITALS — BP 138/89 | HR 75 | Ht 67.0 in | Wt 209.8 lb

## 2015-03-18 DIAGNOSIS — F203 Undifferentiated schizophrenia: Secondary | ICD-10-CM

## 2015-03-18 DIAGNOSIS — F202 Catatonic schizophrenia: Secondary | ICD-10-CM | POA: Diagnosis not present

## 2015-03-18 MED ORDER — PROPRANOLOL HCL 10 MG PO TABS: 10.0000 mg | ORAL_TABLET | Freq: Three times a day (TID) | ORAL | Status: DC

## 2015-03-18 MED ORDER — PERPHENAZINE 8 MG PO TABS: 16.0000 mg | ORAL_TABLET | Freq: Every day | ORAL | Status: DC

## 2015-03-18 NOTE — Progress Notes (Signed)
Patient ID: Sherry Navarro, female   DOB: 10/22/1941, 74 y.o.   MRN: [id number] Patient ID: Sherry Navarro, female   DOB: 06/15/1941, 74 y.o.   MRN: [id number] Patient ID: Sherry Navarro, female   DOB: 07/28/1941, 74 y.o.   MRN: [id number] Patient ID: Sherry Navarro, female   DOB: 07/28/1941, 74 y.o.   MRN: [id number] Patient ID: Sherry Navarro, female   DOB: 01/30/1941, 74 y.o.   MRN: [id number] Patient ID: Sherry Navarro, female   DOB: 12/15/1940, 74 y.o.   MRN: [id number] Patient ID: Sherry Navarro, female   DOB: 05/18/1941, 74 y.o.   MRN: [id number] [facility] [id number] Progress Note Sherry Sleighancy C Dimock MRN: [id number] DOB: 02/11/1941 Age: 74 y.o.  Date: 03/18/2015 Start Time: 2:15 PM End Time: 2:35 PM  Chief Complaint: Chief Complaint  Patient presents with  . Paranoid   Subjective: "I'm doing pretty good".   This patient is a 74 year old widowed white female who lives with her sister in [place]. She used to work as a Tree surgeonbeautician  The patient states that she has had mental illness since her early 4020s. She was not married to a man was running around on her and she had a breakdown and was diagnosed with schizophrenia. We don't have any documentation of this. She was on Navane for years but it was no longer being manufactured to several months ago she was put on perphenazine 16 mg per day. She has terrible shaking in her hands and some in her feet which is probably secondary to these neuroleptic medications. She claims she had this shaking even before she was on medicine but that it's difficult to believe. She claims that she tries to get off these medicines she gets terrible headaches but I think we need to try given her shakiness. She claims she's tried Inderal Cogentin and benzodiazepines but nothing has helped.  Currently she has no paranoia auditory or visual hallucinations depression or suicidal ideation. She her sister go shopping every day and ought to be and she seems happy with her current  lifestyle  The patient returns after 3 months. She still has tremors in both hands that are probably the result of the phenothiazine she's been on most of her life. She states she doesn't want to take medicine to try to combat this but I strongly suggested we try something else like propranolol. She eventually agreed. She denies having paranoia or auditory or visual hallucinations and she is sleeping well. She and her sister are staying active Current psychiatric medication Perphenazine 16 mg each bedtime  Vitals: BP 138/89 mmHg  Pulse 75  Ht 5\' 7"  (1.702 m)  Wt 95.165 kg (209 lb 12.8 oz)  BMI 32.85 kg/m2  Allergies: Allergies  Allergen Reactions  . Thorazine [Chlorpromazine] Other (See Comments)    seizures   Medical History: Past Medical History  Diagnosis Date  . High cholesterol   . Osteoarthritis   . Thyroid disorder   . AC (acromioclavicular) joint bone spurs     on hip joints   . Hiatal hernia   . Schizophrenia   . Seizures   The patient has history of hyperlipidemia, Oster arthritis, hypothyroidism and hiatal hernia.  Her primary care physician is Dr. Lorrene Reidana Diego.  Surgical History: Past Surgical History  Procedure Laterality Date  . 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 [name]    . Rt knee replacement not by dr. [name]    . Cholecystectomy  Family History: family history includes Depression in her other. There is no history of Schizophrenia, ADD / ADHD, Alcohol abuse, Drug abuse, Anxiety disorder, Bipolar disorder, Dementia, OCD, Paranoid behavior, Seizures, Sexual abuse, or Physical abuse. Reviewed and nothing is new today.  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  She has been admitted at least 3 times for psychosis and delusional behavior.  She is been stable on her current psychiatric medication for a long time and is scared to change them.  Psychosocial history Patient lives with her sister.  She has  one son who lives in [place].  Mental status examination Patient is casually dressed and fairly groomed. She is elderly woman who appears to be in her stated age.  She maintained fair eye contact.  She is tremulous in both hands  She maintained fair eye contact. Her speech is slow but clear and coherent. She denies any active or passive suicidal thinking or homicidal thinking. She described her mood is okay and her affect is mood congruent. She denies delusions or auditory or visual hallucinations today Her attention and concentration is fair. She's alert and oriented x3.  There are some tremors present in her hands but they are chronic in nature. Her insight judgment and impulse control is okay.  Lab Results:  No results found for this or any previous visit (from the past 8736 hour(s)).PCP draws labs on regular basis.  No concerns are noted, except elevated cholesterol.  Assessment Axis I Schizophrenia chronic paranoid type Axis II deferred Axis III see medical history, also add antipsychotic-induced tardive dyskinesia Axis IV mild to moderate  Plan/Discussion: I took her vitals.  I reviewed CC, tobacco/med/surg Hx, meds effects/ side effects, problem list, therapies and responses as well as current situation/symptoms discussed options.  She will continue perphenazine 16 mg per day for schizophrenia. She'll add propranolol 10 mg 3 times a day for tremor. She'll return in 4 months  See orders and pt instructions for more details.  MEDICATIONS this encounter: Meds ordered this encounter  Medications  . levothyroxine (SYNTHROID, LEVOTHROID) 25 MCG tablet    Sig: Take 25 mcg by mouth daily before breakfast.  . naproxen sodium (ANAPROX) 550 MG tablet    Sig: Take 550 mg by mouth as needed.  Marland Kitchen. perphenazine (TRILAFON) 8 MG tablet    Sig: Take 2 tablets (16 mg total) by mouth at bedtime.    Dispense:  60 tablet    Refill:  3  . propranolol (INDERAL) 10 MG tablet    Sig: Take 1 tablet (10 mg  total) by mouth 3 (three) times daily.    Dispense:  90 tablet    Refill:  3   Medical Decision Making Problem Points:  Established problem, stable/improving (1), New problem, with no additional work-up planned (3), Review of last therapy session (1) and Review of psycho-social stressors (1) Data Points:  Review or order clinical lab tests (1) Review of medication regiment & side effects (2) Review of new medications or change in dosage (2)  I certify that outpatient services furnished can reasonably be expected to improve the patient's condition.   Diannia RuderOSS, DEBORAH, MD

## 2015-04-21 ENCOUNTER — Encounter: Payer: Self-pay | Admitting: Orthopedic Surgery

## 2015-04-21 ENCOUNTER — Ambulatory Visit (INDEPENDENT_AMBULATORY_CARE_PROVIDER_SITE_OTHER): Payer: PPO | Admitting: Orthopedic Surgery

## 2015-04-21 VITALS — BP 130/64 | Ht 67.0 in | Wt 213.0 lb

## 2015-04-21 DIAGNOSIS — M5432 Sciatica, left side: Secondary | ICD-10-CM

## 2015-04-21 NOTE — Progress Notes (Signed)
Chief Complaint  Patient presents with  . Follow-up    3 month follow up + repeat left hip injection   BP 130/64 mmHg  Ht 5\' 7"  (1.702 m)  Wt 213 lb (96.616 kg)  BMI 33.35 kg/m2  Encounter Diagnosis  Name Primary?  . Sciatica, left Yes    Month injection left hip area for sciatic pain in residual back discomfort after disc surgery  Verbal consent and timeout left buttock 40 mg Depo-Medrol 3 mL 1% lidocaine ethyl chloride  No complications

## 2015-07-13 ENCOUNTER — Ambulatory Visit (HOSPITAL_COMMUNITY): Payer: Self-pay | Admitting: Psychiatry

## 2015-07-21 ENCOUNTER — Ambulatory Visit: Payer: PPO | Admitting: Orthopedic Surgery

## 2015-07-21 ENCOUNTER — Encounter: Payer: Self-pay | Admitting: Orthopedic Surgery

## 2015-08-05 ENCOUNTER — Ambulatory Visit (INDEPENDENT_AMBULATORY_CARE_PROVIDER_SITE_OTHER): Payer: PPO | Admitting: Psychiatry

## 2015-08-05 ENCOUNTER — Encounter (HOSPITAL_COMMUNITY): Payer: Self-pay | Admitting: Psychiatry

## 2015-08-05 VITALS — BP 144/73 | HR 74 | Ht 67.0 in | Wt 228.8 lb

## 2015-08-05 DIAGNOSIS — F203 Undifferentiated schizophrenia: Secondary | ICD-10-CM

## 2015-08-05 DIAGNOSIS — F200 Paranoid schizophrenia: Secondary | ICD-10-CM | POA: Diagnosis not present

## 2015-08-05 DIAGNOSIS — F202 Catatonic schizophrenia: Secondary | ICD-10-CM

## 2015-08-05 MED ORDER — PERPHENAZINE 8 MG PO TABS: 16.0000 mg | ORAL_TABLET | Freq: Every day | ORAL | Status: DC

## 2015-08-05 NOTE — Progress Notes (Signed)
Patient ID: Sherry Navarro, female   DOB: 1941/01/24, 74 y.o.   MRN: [id number] Patient ID: Sherry Navarro, female   DOB: 07-04-1941, 74 y.o.   MRN: [id number] Patient ID: Sherry Navarro, female   DOB: November 26, 1940, 74 y.o.   MRN: [id number] Patient ID: Sherry Navarro, female   DOB: 07-04-1941, 74 y.o.   MRN: [id number] Patient ID: Sherry Navarro, female   DOB: 04/03/1941, 74 y.o.   MRN: [id number] Patient ID: Sherry Navarro, female   DOB: [birth year], 74 y.o.   MRN: [id number] Patient ID: Sherry Navarro, female   DOB: 21-May-1941, 74 y.o.   MRN: [id number] Patient ID: Sherry Navarro, female   DOB: Feb 03, 1941, 74 y.o.   MRN: [id number] [facility] [id number] Progress Note Sherry Navarro MRN: [id number] DOB: 1940-11-12 Age: 74 y.o.  Date: 08/05/2015 Start Time: 2:15 PM End Time: 2:35 PM  Chief Complaint: Chief Complaint  Patient presents with  . Schizophrenia  . Follow-up   Subjective: "I'm doing pretty good".   This patient is a 74 year old widowed white female who lives with her sister in [place]. She used to work as a Tree surgeon  The patient states that she has had mental illness since her early 45s. She was not married to a man was running around on her and she had a breakdown and was diagnosed with schizophrenia. We don't have any documentation of this. She was on Navane for years but it was no longer being manufactured to several months ago she was put on perphenazine 16 mg per day. She has terrible shaking in her hands and some in her feet which is probably secondary to these neuroleptic medications. She claims she had this shaking even before she was on medicine but that it's difficult to believe. She claims that she tries to get off these medicines she gets terrible headaches but I think we need to try given her shakiness. She claims she's tried Inderal Cogentin and benzodiazepines but nothing has helped.  Currently she has no paranoia auditory or visual hallucinations depression or suicidal  ideation. She her sister go shopping every day and ought to be and she seems happy with her current lifestyle  The patient returns after 3 months. She still has tremors in both hands that are probably the result of the phenothiazine she's been on most of her life. She states that the propranolol I prescribed for did not help. 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 She seems more manic today talking faster and is wearing a lot of makeup and jewelry which she claims this candidate gave to her. She also states that he's come to visit her personally in [place]. I brought her sister in and the sister states she knows that the patient is delusional but she's not been dangerous to self or others. The patient adamantly refuses any medication to help calm her down and help her sleep or to decrease her psychosis but she is willing to continue the perphenazine. I gave the sister my card so that she can call if things get worse but at this point the patient is not committable Current psychiatric medication Perphenazine 16 mg each bedtime  Vitals: BP 144/73 mmHg  Pulse 74  Ht  (1.702 m)  Wt 228 lb 12.8 oz (103.783 kg)  BMI 35.83 kg/m2  Allergies: Allergies  Allergen Reactions  . Thorazine [Chlorpromazine] Other (See Comments)    seizures   Medical  History: Past Medical History  Diagnosis Date  . High cholesterol   . Osteoarthritis   . Thyroid disorder   . AC (acromioclavicular) joint bone spurs     on hip joints   . Hiatal hernia   . Schizophrenia (HCC)   . Seizures (HCC)   The patient has history of hyperlipidemia, Oster arthritis, hypothyroidism and hiatal hernia.  Her primary care physician is Dr. Lorrene Reid.  Surgical History: Past Surgical History  Procedure Laterality Date  . 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 Romeo Apple    . Rt knee replacement not by dr. Romeo Apple    . Cholecystectomy     Family History: family history  includes Depression in her other. There is no history of Schizophrenia, ADD / ADHD, Alcohol abuse, Drug abuse, Anxiety disorder, Bipolar disorder, Dementia, OCD, Paranoid behavior, Seizures, Sexual abuse, or Physical abuse. Reviewed and nothing is new today.  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  She has been admitted at least 3 times for psychosis and delusional behavior.  She is been stable on her current psychiatric medication for a long time and is scared to change them.  Psychosocial history Patient lives with her sister.  She has one son who lives in [place].  Mental status examination Patient is casually dressed and fairly groomed she is wearing more makeup and jewelry than usual. She is elderly woman who appears to be in her stated age.  She maintained fair eye contact.  She is tremulous in both hands  She maintained fair eye contact. Her speech is a little bit more pressured. She denies any active or passive suicidal thinking or homicidal thinking. She described her mood is somewhat euphoric and her affect is mood congruent. She has numerous delusions as outlined above but denies auditory or visual hallucinations today Her attention and concentration is fair. She's alert and oriented x3.  There are some tremors present in her hands but they are chronic in nature. Her insight judgment and impulse control is okay.  Lab Results:  No results found for this or any previous visit (from the past 8736 hour(s)).PCP draws labs on regular basis.  No concerns are noted, except elevated cholesterol.  Assessment Axis I Schizophrenia chronic paranoid type Axis II deferred Axis III see medical history, also add antipsychotic-induced tardive dyskinesia Axis IV mild to moderate  Plan/Discussion: I took her vitals.  I reviewed CC, tobacco/med/surg Hx, meds effects/ side effects, problem list, therapies and responses as well as current  situation/symptoms discussed options.  She will continue perphenazine 16 mg per day for schizophrenia. She refuses any increase or changes to medication. I've spoken to her sister about how to contact me if things worsen otherwise she'll return in 6 weeks  See orders and pt instructions for more details.  MEDICATIONS this encounter: Meds ordered this encounter  Medications  . perphenazine (TRILAFON) 8 MG tablet    Sig: Take 2 tablets (16 mg total) by mouth at bedtime.    Dispense:  60 tablet    Refill:  3   Medical Decision Making Problem Points:  Established problem, stable/improving (1), New problem, with no additional work-up planned (3), Review of last therapy session (1) and Review of psycho-social stressors (1) Data Points:  Review or order clinical lab tests (1) Review of medication regiment & side effects (2) Review of new medications or change in dosage (2)  I certify that outpatient services furnished can reasonably be expected to  improve the patient's condition.   Levonne Spiller, MD

## 2015-08-18 ENCOUNTER — Ambulatory Visit: Payer: PPO | Admitting: Orthopedic Surgery

## 2015-08-18 ENCOUNTER — Encounter: Payer: Self-pay | Admitting: Orthopedic Surgery

## 2015-09-01 ENCOUNTER — Ambulatory Visit (INDEPENDENT_AMBULATORY_CARE_PROVIDER_SITE_OTHER): Payer: PPO | Admitting: Orthopedic Surgery

## 2015-09-01 ENCOUNTER — Encounter: Payer: Self-pay | Admitting: Orthopedic Surgery

## 2015-09-01 VITALS — BP 143/79 | Ht 67.0 in | Wt 227.0 lb

## 2015-09-01 DIAGNOSIS — M25552 Pain in left hip: Secondary | ICD-10-CM

## 2015-09-01 NOTE — Progress Notes (Signed)
Chief Complaint  Patient presents with  . Follow-up    Repeat injections in left hips.    Patient comes in for her intramuscular injection left hip for chronic left hip pain status post fusion surgery of the lumbar spine  After verbal consent was obtained the patient's injection site was confirmed by timeout and then alcohol and ethyl chloride were used to repair the skin for intramuscular injection of 40 mg Depo-Medrol and 2 mL 1% lidocaine  Follow-up 3 months

## 2015-09-14 ENCOUNTER — Encounter (HOSPITAL_COMMUNITY): Payer: Self-pay | Admitting: Psychiatry

## 2015-09-14 ENCOUNTER — Ambulatory Visit (INDEPENDENT_AMBULATORY_CARE_PROVIDER_SITE_OTHER): Payer: PPO | Admitting: Psychiatry

## 2015-09-14 VITALS — BP 131/79 | HR 74 | Ht 67.0 in | Wt 226.6 lb

## 2015-09-14 DIAGNOSIS — F200 Paranoid schizophrenia: Secondary | ICD-10-CM

## 2015-09-14 DIAGNOSIS — F202 Catatonic schizophrenia: Secondary | ICD-10-CM

## 2015-09-14 DIAGNOSIS — F203 Undifferentiated schizophrenia: Secondary | ICD-10-CM

## 2015-09-14 MED ORDER — PERPHENAZINE 8 MG PO TABS: 16.0000 mg | ORAL_TABLET | Freq: Every day | ORAL | Status: DC

## 2015-09-14 NOTE — Progress Notes (Signed)
Patient ID: Sherry Navarro Berling, female   DOB: 12/10/1940, 74 y.o.   MRN: [id number] Patient ID: Sherry Navarro Hidrogo, female   DOB: 09/24/1941, 74 y.o.   MRN: [id number] Patient ID: Sherry Navarro Hathorne, female   DOB: 01/07/1941, 74 y.o.   MRN: [id number] Patient ID: Sherry Navarro Koffman, female   DOB: 06/30/1941, 74 y.o.   MRN: [id number] Patient ID: Sherry Navarro Marchena, female   DOB: 05/11/1941, 74 y.o.   MRN: [id number] Patient ID: Sherry Navarro Gaal, female   DOB: 11/12/1940, 74 y.o.   MRN: [id number] Patient ID: Sherry Navarro Shoemaker, female   DOB: 07/05/1941, 74 y.o.   MRN: [id number] Patient ID: Sherry Navarro Southers, female   DOB: 07/04/1941, 74 y.o.   MRN: [id number] Patient ID: Sherry Navarro Catoe, female   DOB: 05/05/1941, 74 y.o.   MRN: [id number] [id number] Progress Note Sherry Navarro Kassel MRN: [id number] DOB: 01/07/1941 Age: 74 y.o.  Date: 09/14/2015 Start Time: 2:15 PM End Time: 2:35 PM  Chief Complaint: Chief Complaint  Patient presents with  . Schizophrenia  . Follow-up   Subjective: "I'm doing pretty good".   This patient is a 74 year old widowed white female who lives [place] her sister in [place]. She used to work as a Tree surgeonbeautician  The patient states that she has had mental illness since her early 6620s. She was not married to a man was running around on her and she had a breakdown and was diagnosed with schizophrenia. We don't have any documentation of this. She was on Navane for years but it was no longer being manufactured to several months ago she was put on perphenazine 16 mg per day. She has terrible shaking in her hands and some in her feet which is probably secondary to these neuroleptic medications. She claims she had this shaking even before she was on medicine but that it's difficult to believe. She claims that she tries to get off these medicines she gets terrible headaches but I think we need to try given her shakiness. She claims she's tried Inderal Cogentin and benzodiazepines but nothing has helped.  Currently she  has no paranoia auditory or visual hallucinations depression or suicidal ideation. She her sister go shopping every day and ought to be and she seems happy with her current lifestyle  The patient returns after 4 weeks. She came for a sooner visit this time because she seemed manic last time. She was talking about being the mother of Sherry KoyanagiDonald Trump and other delusions. She states that leading up to the election she was not sleeping much and this is probably why she became hypomanic. Now that the election is over she is back to her normal routine and getting plenty of sleep. She is calmer quieter in and has not brought up any delusional material today. She still has a tremor from the perphenazine but she claims that it is getting better Current psychiatric medication Perphenazine 16 mg each bedtime  Vitals: BP 131/79 mmHg  Pulse 74  Ht 5\' 7"  (1.702 m)  Wt 226 lb 9.6 oz (102.785 kg)  BMI 35.48 kg/m2  SpO2 94%  Allergies: Allergies  Allergen Reactions  . Thorazine [Chlorpromazine] Other (See Comments)    seizures   Medical History: Past Medical History  Diagnosis Date  . High cholesterol   . Osteoarthritis   . Thyroid disorder   . AC (acromioclavicular) joint bone spurs     on hip joints   . Hiatal hernia   . Schizophrenia (HCC)   .  Seizures (HCC)   The patient has history of hyperlipidemia, Oster arthritis, hypothyroidism and hiatal hernia.  Her primary care physician is Dr. Lorrene Reid.  Surgical History: Past Surgical History  Procedure Laterality Date  . 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 Romeo Apple    . Rt knee replacement not by dr. Romeo Apple    . Cholecystectomy     Family History: family history includes Depression in her other. There is no history of Schizophrenia, ADD / ADHD, Alcohol abuse, Drug abuse, Anxiety disorder, Bipolar disorder, Dementia, OCD, Paranoid behavior, Seizures, Sexual abuse, or Physical abuse. Reviewed and nothing is new today.  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  She has been admitted at least 3 times for psychosis and delusional behavior.  She is been stable on her current psychiatric medication for a long time and is scared to change them.  Psychosocial history Patient lives with her sister.  She has one son who lives in [place].  Mental status examination Patient is casually dressed and fairly groomed.She is elderly woman who appears to be in her stated age.  She maintained fair eye contact.  She is tremulous in both hands  She maintained fair eye contact. Her speech is a less pressured. She denies any active or passive suicidal thinking or homicidal thinking. She described her mood is somewhat good but no longer manic and her affect is mood  She has has not mentioned any delusions and denies auditory or visual hallucinations today Her attention and concentration is fair. She's alert and oriented x3.  There are some tremors present in her hands but they are chronic in nature. Her insight judgment and impulse control is okay.  Lab Results:  No results found for this or any previous visit (from the past 8736 hour(s)).PCP draws labs on regular basis.  No concerns are noted, except elevated cholesterol.  Assessment Axis I Schizophrenia chronic paranoid type Axis II deferred Axis III see medical history, also add antipsychotic-induced tardive dyskinesia Axis IV mild to moderate  Plan/Discussion: I took her vitals.  I reviewed CC, tobacco/med/surg Hx, meds effects/ side effects, problem list, therapies and responses as well as current situation/symptoms discussed options.  She will continue perphenazine 16 mg per day for schizophrenia. She will return in 3 months  MEDICATIONS this encounter: Meds ordered this encounter  Medications  . NABUMETONE PO    Sig: Take 750 mg by mouth 2 (two) times daily as needed.  Marland Kitchen perphenazine (TRILAFON) 8 MG tablet    Sig: Take 2 tablets (16 mg  total) by mouth at bedtime.    Dispense:  60 tablet    Refill:  3   Medical Decision Making Problem Points:  Established problem, stable/improving (1), New problem, with no additional work-up planned (3), Review of last therapy session (1) and Review of psycho-social stressors (1) Data Points:  Review or order clinical lab tests (1) Review of medication regiment & side effects (2) Review of new medications or change in dosage (2)  I certify that outpatient services furnished can reasonably be expected to improve the patient's condition.   Diannia Ruder, MD

## 2015-09-17 ENCOUNTER — Ambulatory Visit (HOSPITAL_COMMUNITY): Payer: Self-pay | Admitting: Psychiatry

## 2015-11-04 ENCOUNTER — Encounter: Payer: Self-pay | Admitting: *Deleted

## 2015-11-10 ENCOUNTER — Encounter (HOSPITAL_COMMUNITY): Payer: Self-pay | Admitting: Emergency Medicine

## 2015-11-10 ENCOUNTER — Emergency Department (HOSPITAL_COMMUNITY)
Admission: EM | Admit: 2015-11-10 | Discharge: 2015-11-10 | Disposition: A | Discharge status: Home / Self Care | Payer: PPO | Attending: Emergency Medicine | Admitting: Emergency Medicine

## 2015-11-10 ENCOUNTER — Emergency Department (HOSPITAL_COMMUNITY): Payer: PPO

## 2015-11-10 DIAGNOSIS — Z791 Long term (current) use of non-steroidal anti-inflammatories (NSAID): Secondary | ICD-10-CM | POA: Insufficient documentation

## 2015-11-10 DIAGNOSIS — M25562 Pain in left knee: Secondary | ICD-10-CM

## 2015-11-10 DIAGNOSIS — Z8719 Personal history of other diseases of the digestive system: Secondary | ICD-10-CM | POA: Diagnosis not present

## 2015-11-10 DIAGNOSIS — E079 Disorder of thyroid, unspecified: Secondary | ICD-10-CM | POA: Insufficient documentation

## 2015-11-10 DIAGNOSIS — Z79899 Other long term (current) drug therapy: Secondary | ICD-10-CM | POA: Insufficient documentation

## 2015-11-10 DIAGNOSIS — Z8659 Personal history of other mental and behavioral disorders: Secondary | ICD-10-CM | POA: Diagnosis not present

## 2015-11-10 DIAGNOSIS — Z87891 Personal history of nicotine dependence: Secondary | ICD-10-CM | POA: Insufficient documentation

## 2015-11-10 DIAGNOSIS — M1990 Unspecified osteoarthritis, unspecified site: Secondary | ICD-10-CM | POA: Insufficient documentation

## 2015-11-10 DIAGNOSIS — M1712 Unilateral primary osteoarthritis, left knee: Secondary | ICD-10-CM | POA: Diagnosis not present

## 2015-11-10 MED ORDER — HYDROCODONE-ACETAMINOPHEN 5-325 MG PO TABS: 1.0000 | ORAL_TABLET | Freq: Four times a day (QID) | ORAL | Status: DC | PRN

## 2015-11-10 MED ORDER — HYDROCODONE-ACETAMINOPHEN 5-325 MG PO TABS
1.0000 | ORAL_TABLET | Freq: Once | ORAL | Status: AC
  Administered 2015-11-10: 1 via ORAL
  Filled 2015-11-10: qty 1

## 2015-11-10 NOTE — ED Provider Notes (Signed)
CSN: [id number]     Arrival date & time 11/10/15  1033 History  By signing my name below, I, Jarvis Morgan, attest that this documentation has been prepared under the direction and in the presence of Blane Ohara, MD. Electronically Signed: Jarvis Morgan, ED Scribe. 11/10/2015. 12:27 PM.    Chief Complaint  Patient presents with  . Knee Pain   The history is provided by the patient. No language interpreter was used.    HPI Comments: Sherry Navarro is a 75 y.o. female with a h/o osetoarthritis, schizophrenia and seizures who presents to the Emergency Department complaining of an exacerbation of her chronic left knee pain that has began to gradually worsen over the past 2 days. She states the pain is constant and feels sharp and like "bone grinding on bone". She notes the pain is exacerbated with applied pressure to the knee. Pt takes Naprosyn and Nabumetone with no significant relief. Pt has has an appt with her PCP next week. She denies any recent falls or injury. Pt denies any h/o heart issues. She denies any abdominal pain, chest pain, SOB, fevers, chills, color change, warmth or other associated symptoms at this time.    Past Medical History  Diagnosis Date  . High cholesterol   . Osteoarthritis   . Thyroid disorder   . AC (acromioclavicular) joint bone spurs     on hip joints   . Hiatal hernia   . Schizophrenia (HCC)   . Seizures [facility])    Past Surgical History  Procedure Laterality Date  . 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 Romeo Apple    . Rt knee replacement not by dr. Romeo Apple    . Cholecystectomy     Family History  Problem Relation Age of Onset  . Schizophrenia Neg Hx   . ADD / ADHD Neg Hx   . Alcohol abuse Neg Hx   . Drug abuse Neg Hx   . Anxiety disorder Neg Hx   . Bipolar disorder Neg Hx   . Dementia Neg Hx   . OCD Neg Hx   . Paranoid behavior Neg Hx   . Seizures Neg Hx   . Sexual abuse Neg Hx   . Physical abuse Neg Hx   . Depression Other    Social  History  Substance Use Topics  . Smoking status: Former Games developer  . Smokeless tobacco: Never Used  . Alcohol Use: No   OB History    No data available     Review of Systems  Musculoskeletal: Positive for arthralgias.  All other systems reviewed and are negative.     Allergies  Thorazine  Home Medications   Prior to Admission medications   Medication Sig Start Date End Date Taking? Authorizing Provider  Cimetidine (TAGAMET PO) Take 300 mg by mouth at bedtime.    Yes Historical Provider, MD  levothyroxine (SYNTHROID, LEVOTHROID) 25 MCG tablet Take 25 mcg by mouth daily before breakfast.   Yes Historical Provider, MD  Multiple Vitamins-Minerals (ONE-A-DAY WOMENS 50 PLUS PO) Take 1 tablet by mouth daily.   Yes Historical Provider, MD  NABUMETONE PO Take 750 mg by mouth 2 (two) times daily as needed.   Yes Historical Provider, MD  naproxen (NAPROSYN) 500 MG tablet Take 500 mg by mouth 2 (two) times daily with a meal.   Yes Historical Provider, MD  Omega-3 Fatty Acids (FISH OIL PO) Take 1 capsule by mouth at bedtime.   Yes Historical Provider, MD  perphenazine (TRILAFON) 8 MG  tablet Take 2 tablets (16 mg total) by mouth at bedtime. 09/14/15  Yes Myrlene Broker, MD  HYDROcodone-acetaminophen (NORCO) 5-325 MG tablet Take 1 tablet by mouth every 6 (six) hours as needed. 11/10/15   Blane Ohara, MD   Triage Vitals: BP 160/79 mmHg  Pulse 71  Temp(Src) 97.5 F (36.4 C)  Resp 18  Ht  (1.702 m)  Wt 230 lb (104.327 kg)  BMI 36.01 kg/m2  SpO2 97%  Physical Exam  Constitutional: She is oriented to person, place, and time. She appears well-developed and well-nourished. No distress.  HENT:  Head: Normocephalic and atraumatic.  Mouth/Throat: Oropharynx is clear and moist.  Eyes: Conjunctivae and EOM are normal. Pupils are equal, round, and reactive to light.  Neck: Neck supple. No tracheal deviation present.  Cardiovascular: Normal rate, regular rhythm and normal heart sounds.    Pulses:      Dorsalis pedis pulses are 2+ on the right side.       Posterior tibial pulses are 2+ on the right side, and 2+ on the left side.  Pulmonary/Chest: Effort normal and breath sounds normal. No respiratory distress. She has no wheezes. She has no rales.  Musculoskeletal: Normal range of motion.  Pain with flexion and extension of left knee  Mild effusion of left knee, no warmth or rash Pain with Valgus and Varus testing, no obvious ligament laxity  Neurological: She is alert and oriented to person, place, and time.  Skin: Skin is warm and dry.  Psychiatric: She has a normal mood and affect. Her behavior is normal.  Nursing note and vitals reviewed.   ED Course  Procedures (including critical care time)  DIAGNOSTIC STUDIES: Oxygen Saturation is 97% on RA, normal by my interpretation.    COORDINATION OF CARE:  11:21 AM- Will order imaging of left knee.  Pt advised of plan for treatment and pt agrees.  Labs Review Labs Reviewed - No data to display  Imaging Review Dg Knee Complete 4 Views Left  11/10/2015  CLINICAL DATA:  Exacerbation of chronic left knee pain which is been worsening over the last 2 days. EXAM: LEFT KNEE - COMPLETE 4+ VIEW COMPARISON:  None. FINDINGS: No evidence of fracture. Loss of joint space is noted in the medial compartment. Hypertrophic spurring is visible in all 3 compartments. No evidence for fluid in the suprapatellar bursa. Several mineralized fragments are seen posterior to the knee, likely representing intra-articular loose bodies, some of which may be contained within a Baker cyst. IMPRESSION: No acute bony abnormality. Tricompartmental degenerative changes. Probable intra-articular loose bodies. Electronically Signed   By: Kennith Center M.D.   On: 11/10/2015 11:27   I have personally reviewed and evaluated these images as part of my medical decision-making.   EKG Interpretation None      MDM   Final diagnoses:  Left knee pain   Arthritis of knee, left   I personally performed the services described in this documentation, which was scribed in my presence. The recorded information has been reviewed and is accurate.  Patient presents with worsening knee pain no sign of infection, no trauma. Severe arthritis similar to previous. X-ray reviewed. Discussed outpatient follow-up with orthopedic and primary doctor. Pain medicines given.  Results and differential diagnosis were discussed with the patient/parent/guardian. Xrays were independently reviewed by myself.  Close follow up outpatient was discussed, comfortable with the plan.   Medications  HYDROcodone-acetaminophen (NORCO/VICODIN) 5-325 MG per tablet 1 tablet (1 tablet Oral Given 11/10/15 1123)  Filed Vitals:   11/10/15 1033 11/10/15 1100 11/10/15 1130 11/10/15 1200  BP: 160/79 153/75 169/92 159/85  Pulse: 71 71 73 68  Temp: 97.5 F (36.4 C)     Resp: 18     Height: 5\' 7"  (1.702 m)     Weight: 230 lb (104.327 kg)     SpO2: 97% 98% 94% 95%    Final diagnoses:  Left knee pain  Arthritis of knee, left   ,  Blane Ohara, MD 11/10/15 1227

## 2015-11-10 NOTE — Discharge Instructions (Signed)
If you were given medicines take as directed.  If you are on coumadin or contraceptives realize their levels and effectiveness is altered by many different medicines.  If you have any reaction (rash, tongues swelling, other) to the medicines stop taking and see a physician.    If your blood pressure was elevated in the ER make sure you follow up for management with a primary doctor or return for chest pain, shortness of breath or stroke symptoms.  Please follow up as directed and return to the ER or see a physician for new or worsening symptoms.  Thank you. Filed Vitals:   11/10/15 1033 11/10/15 1100 11/10/15 1130 11/10/15 1200  BP: 160/79 153/75 169/92 159/85  Pulse: 71 71 73 68  Temp: 97.5 F (36.4 C)     Resp: 18     Height: 5\' 7"  (1.702 m)     Weight: 230 lb (104.327 kg)     SpO2: 97% 98% 94% 95%

## 2015-11-10 NOTE — ED Notes (Signed)
Pt c/o left knee pain x 1 year, worse over the past 2 days. Pt states she is unable to bear weight. Pt states she has an appointment with pcp next Wednesday. C/m/s intact.

## 2015-11-11 ENCOUNTER — Telehealth: Payer: Self-pay | Admitting: *Deleted

## 2015-11-11 NOTE — Telephone Encounter (Signed)
Patient called requesting Dr. [name] to call her in Hydrocodone for her Lt knee, patient said she is having a lot of pain, patient was seen in the ER 11/10/15 and she is scheduled to see Dr. [name] for her lt knee 11/24/15 at 3:45. Patient stated the Er gave her 6 pills and that is not enough to last until this appointment. Please advise [phone]

## 2015-11-11 NOTE — Telephone Encounter (Signed)
Routing to Dr Harrison 

## 2015-11-12 NOTE — Telephone Encounter (Signed)
Obvious answer

## 2015-11-12 NOTE — Telephone Encounter (Signed)
Patient advised that any prescriptions will be addressed at upcoming office visit

## 2015-11-24 ENCOUNTER — Ambulatory Visit (INDEPENDENT_AMBULATORY_CARE_PROVIDER_SITE_OTHER): Payer: PPO | Admitting: Orthopedic Surgery

## 2015-11-24 VITALS — BP 163/89 | Ht 67.0 in | Wt 226.0 lb

## 2015-11-24 DIAGNOSIS — M659 Synovitis and tenosynovitis, unspecified: Secondary | ICD-10-CM

## 2015-11-24 DIAGNOSIS — M129 Arthropathy, unspecified: Secondary | ICD-10-CM

## 2015-11-24 DIAGNOSIS — M1710 Unilateral primary osteoarthritis, unspecified knee: Secondary | ICD-10-CM

## 2015-11-24 DIAGNOSIS — M65969 Unspecified synovitis and tenosynovitis, unspecified lower leg: Secondary | ICD-10-CM

## 2015-11-24 NOTE — Progress Notes (Signed)
Patient ID: Sherry Navarro, female   DOB: October 21, 1941, 75 y.o.   MRN: [id number]  Chief Complaint  Patient presents with  . Follow-up    er follow up Left knee pain    HPI ADEOLA Navarro is a 75 y.o. female.  H medical onset left knee pain history of chronic knee pain went to the ER. Left knee pain  Location left knee. Quality dull ache. Severity moderate to severe. Duration acute onset with underlying chronic pain. Timing constant worse with cold weather   Review of Systems Review of Systems 1. Mechanical symptoms negative  2. Numbness tingling negative but does have  3. Peripheral edema  Past Medical History  Diagnosis Date  . High cholesterol   . Osteoarthritis   . Thyroid disorder   . AC (acromioclavicular) joint bone spurs     on hip joints   . Hiatal hernia   . Schizophrenia (HCC)   . Seizures [facility])     Past Surgical History  Procedure Laterality Date  . 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 Romeo Apple    . Rt knee replacement not by dr. Romeo Apple    . Cholecystectomy      Social History Social History  Substance Use Topics  . Smoking status: Former Games developer  . Smokeless tobacco: Never Used  . Alcohol Use: No    Allergies  Allergen Reactions  . Thorazine [Chlorpromazine] Other (See Comments)    seizures    Current Outpatient Prescriptions  Medication Sig Dispense Refill  . Cimetidine (TAGAMET PO) Take 300 mg by mouth at bedtime.     Marland Kitchen levothyroxine (SYNTHROID, LEVOTHROID) 25 MCG tablet Take 25 mcg by mouth daily before breakfast.    . Multiple Vitamins-Minerals (ONE-A-DAY WOMENS 50 PLUS PO) Take 1 tablet by mouth daily.    Marland Kitchen NABUMETONE PO Take 750 mg by mouth 2 (two) times daily as needed.    . naproxen (NAPROSYN) 500 MG tablet Take 500 mg by mouth 2 (two) times daily with a meal.    . Omega-3 Fatty Acids (FISH OIL PO) Take 1 capsule by mouth at bedtime.    Marland Kitchen perphenazine (TRILAFON) 8 MG tablet Take 2 tablets (16 mg total) by mouth at bedtime. 60 tablet 3    No current facility-administered medications for this visit.      Physical Exam Physical Exam Blood pressure 163/89, height  (1.702 m), weight 226 lb (102.513 kg).  Gen. appearance well-groomed The patient is alert and oriented person place and time Mood is normal affect is normal Ambulatory status favoring the left leg using a cane  Exam of the left lower extremity  Inspection medial tenderness patellar tendon tenderness ROM flexion ARC 120 Stability collateral ligaments and cruciate ligament stable Strength quadriceps strength grade 5  Skin: Skin normal  Pulses: Peripheral edema  Neuro: Normal sensation  Right knee asymptomatic full range of motion   Data Reviewed 4 views of the knee from the hospital dated 11/10/2015 my independent interpretation is that the patient has severe medial joint space narrowing but minimal reactive bone multiple loose bodies can't tell if there in the joint. Impression medial compartment arthritis   Assessment    Arthritis left knee acute exacerbation with synovitis    Plan    Section follow-up as needed  Procedure note left knee injection verbal consent was obtained to inject left knee joint  Timeout was completed to confirm the site of injection  The medications used were 40 mg of Depo-Medrol and  1% lidocaine 3 cc  Anesthesia was provided by ethyl chloride and the skin was prepped with alcohol.  After cleaning the skin with alcohol a 20-gauge needle was used to inject the left knee joint. There were no complications. A sterile bandage was applied.         Fuller Canada 11/24/2015, 4:39 PM

## 2015-12-01 DIAGNOSIS — H2513 Age-related nuclear cataract, bilateral: Secondary | ICD-10-CM | POA: Diagnosis not present

## 2015-12-01 DIAGNOSIS — H25013 Cortical age-related cataract, bilateral: Secondary | ICD-10-CM | POA: Diagnosis not present

## 2015-12-02 DIAGNOSIS — E039 Hypothyroidism, unspecified: Secondary | ICD-10-CM | POA: Diagnosis not present

## 2015-12-02 DIAGNOSIS — E662 Morbid (severe) obesity with alveolar hypoventilation: Secondary | ICD-10-CM | POA: Diagnosis not present

## 2015-12-02 DIAGNOSIS — M545 Low back pain: Secondary | ICD-10-CM | POA: Diagnosis not present

## 2015-12-02 DIAGNOSIS — M2550 Pain in unspecified joint: Secondary | ICD-10-CM | POA: Diagnosis not present

## 2015-12-08 ENCOUNTER — Ambulatory Visit: Payer: Self-pay | Admitting: Orthopedic Surgery

## 2015-12-15 ENCOUNTER — Encounter (HOSPITAL_COMMUNITY): Payer: Self-pay | Admitting: Psychiatry

## 2015-12-15 ENCOUNTER — Ambulatory Visit (INDEPENDENT_AMBULATORY_CARE_PROVIDER_SITE_OTHER): Payer: PPO | Admitting: Psychiatry

## 2015-12-15 VITALS — BP 140/83 | HR 78 | Ht 67.0 in | Wt 232.4 lb

## 2015-12-15 DIAGNOSIS — F203 Undifferentiated schizophrenia: Secondary | ICD-10-CM | POA: Diagnosis not present

## 2015-12-15 DIAGNOSIS — F202 Catatonic schizophrenia: Secondary | ICD-10-CM | POA: Diagnosis not present

## 2015-12-15 MED ORDER — PERPHENAZINE 8 MG PO TABS: 16.0000 mg | ORAL_TABLET | Freq: Every day | ORAL | Status: DC

## 2015-12-15 NOTE — Progress Notes (Signed)
Patient ID: Sherry Navarro, female   DOB: Mar 12, 1941, 75 y.o.   MRN: [id number] Patient ID: Sherry Navarro, female   DOB: Nov 24, 1940, 75 y.o.   MRN: [id number] Patient ID: Sherry Navarro, female   DOB: 1941/09/27, 75 y.o.   MRN: [id number] Patient ID: Sherry Navarro, female   DOB: 04-22-1941, 75 y.o.   MRN: [id number] Patient ID: Sherry Navarro, female   DOB: [birth year], 75 y.o.   MRN: [id number] Patient ID: Sherry Navarro, female   DOB: 1941/04/26, 75 y.o.   MRN: [id number] Patient ID: Sherry Navarro, female   DOB: [birth year], 75 y.o.   MRN: [id number] Patient ID: Sherry Navarro, female   DOB: Aug 31, 1941, 75 y.o.   MRN: [id number] Patient ID: Sherry Navarro, female   DOB: 23-Jun-1941, 75 y.o.   MRN: [id number] Patient ID: Sherry Navarro, female   DOB: [birth year], 75 y.o.   MRN: [id number] [facility] [address] Progress Note Sherry Navarro MRN: [id number] DOB: 1941-03-24 Age: 75 y.o.  Date: 12/15/2015 Start Time: 2:15 PM End Time: 2:35 PM  Chief Complaint: Chief Complaint  Patient presents with  . Schizophrenia  . Follow-up   Subjective: "I'm doing pretty good".   This patient is a 75 year old widowed white female who lives with her sister in [place]. She used to work as a Tree surgeon  The patient states that she has had mental illness since her early 53s. She was not married to a man was running around on her and she had a breakdown and was diagnosed with schizophrenia. We don't have any documentation of this. She was on Navane for years but it was no longer being manufactured to several months ago she was put on perphenazine 16 mg per day. She has terrible shaking in her hands and some in her feet which is probably secondary to these neuroleptic medications. She claims she had this shaking even before she was on medicine but that it's difficult to believe. She claims that she tries to get off these medicines she gets terrible headaches but I think we need to try given her shakiness. She claims she's tried  Inderal Cogentin and benzodiazepines but nothing has helped.  Currently she has no paranoia auditory or visual hallucinations depression or suicidal ideation. She her sister go shopping every day and ought to be and she seems happy with her current lifestyle  The patient returns after 3 months. She states that her knee hurts and she recently had an injection put in it but it still hurting. Mentally she seems to be doing fairly well. She denies delusions or auditory or visual hallucinations or paranoia. Her hands are still shaking but she does not want to discontinue perphenazine. She is sleeping well at night. Current psychiatric medication Perphenazine 16 mg each bedtime  Vitals: BP 140/83 mmHg  Pulse 78  Ht  (1.702 m)  Wt 232 lb 6.4 oz (105.416 kg)  BMI 36.39 kg/m2  SpO2 95%  Allergies: Allergies  Allergen Reactions  . Thorazine [Chlorpromazine] Other (See Comments)    seizures   Medical History: Past Medical History  Diagnosis Date  . High cholesterol   . Osteoarthritis   . Thyroid disorder   . AC (acromioclavicular) joint bone spurs     on hip joints   . Hiatal hernia   . Schizophrenia (HCC)   . Seizures (HCC)   The patient has history of hyperlipidemia, Oster arthritis, hypothyroidism and hiatal hernia.  Her primary care physician is  Dr. Lorrene Reid.  Surgical History: Past Surgical History  Procedure Laterality Date  . 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 Romeo Apple    . Rt knee replacement not by dr. Romeo Apple    . Cholecystectomy     Family History: family history includes Depression in her other. There is no history of Schizophrenia, ADD / ADHD, Alcohol abuse, Drug abuse, Anxiety disorder, Bipolar disorder, Dementia, OCD, Paranoid behavior, Seizures, Sexual abuse, or Physical abuse. Reviewed and nothing is new today.  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  She has been admitted at least 3 times for  psychosis and delusional behavior.  She is been stable on her current psychiatric medication for a long time and is scared to change them.  Psychosocial history Patient lives with her sister.  She has one son who lives in [place].  Mental status examination Patient is casually dressed and fairly groomed.She is elderly woman who appears to be in her stated age.  She maintained fair eye contact.  She is tremulous in both hands  She maintained fair eye contact. Her speech is a less pressured. She denies any active or passive suicidal thinking or homicidal thinking. She described her mood is somewhat good but no longer manic and her affect is mood  She has has not mentioned any delusions and denies auditory or visual hallucinations today Her attention and concentration is fair. She's alert and oriented x3.  There are some tremors present in her hands but they are chronic in nature. Her insight judgment and impulse control is okay.  Lab Results:  No results found for this or any previous visit (from the past 8736 hour(s)).PCP draws labs on regular basis.  No concerns are noted, except elevated cholesterol.  Assessment Axis I Schizophrenia chronic paranoid type Axis II deferred Axis III see medical history, also add antipsychotic-induced tardive dyskinesia Axis IV mild to moderate  Plan/Discussion: I took her vitals.  I reviewed CC, tobacco/med/surg Hx, meds effects/ side effects, problem list, therapies and responses as well as current situation/symptoms discussed options.  She will continue perphenazine 16 mg per day for schizophrenia. She will return in 4 months  MEDICATIONS this encounter: Meds ordered this encounter  Medications  . oxyCODONE (OXY IR/ROXICODONE) 5 MG immediate release tablet    Sig: Take 5 mg by mouth every 4 (four) hours as needed for severe pain.  Marland Kitchen perphenazine (TRILAFON) 8 MG tablet    Sig: Take 2 tablets (16 mg total) by mouth at bedtime.    Dispense:  60 tablet     Refill:  3   Medical Decision Making Problem Points:  Established problem, stable/improving (1), New problem, with no additional work-up planned (3), Review of last therapy session (1) and Review of psycho-social stressors (1) Data Points:  Review or order clinical lab tests (1) Review of medication regiment & side effects (2) Review of new medications or change in dosage (2)  I certify that outpatient services furnished can reasonably be expected to improve the patient's condition.   Diannia Ruder, MD

## 2015-12-18 DIAGNOSIS — I119 Hypertensive heart disease without heart failure: Secondary | ICD-10-CM | POA: Diagnosis not present

## 2015-12-18 DIAGNOSIS — M2550 Pain in unspecified joint: Secondary | ICD-10-CM | POA: Diagnosis not present

## 2015-12-18 DIAGNOSIS — H6121 Impacted cerumen, right ear: Secondary | ICD-10-CM | POA: Diagnosis not present

## 2015-12-18 DIAGNOSIS — M545 Low back pain: Secondary | ICD-10-CM | POA: Diagnosis not present

## 2015-12-21 ENCOUNTER — Ambulatory Visit (INDEPENDENT_AMBULATORY_CARE_PROVIDER_SITE_OTHER): Payer: PPO | Admitting: Orthopedic Surgery

## 2015-12-21 ENCOUNTER — Encounter: Payer: Self-pay | Admitting: Orthopedic Surgery

## 2015-12-21 VITALS — BP 128/73 | Ht 67.0 in | Wt 224.0 lb

## 2015-12-21 DIAGNOSIS — M25562 Pain in left knee: Secondary | ICD-10-CM

## 2015-12-21 DIAGNOSIS — M129 Arthropathy, unspecified: Secondary | ICD-10-CM | POA: Diagnosis not present

## 2015-12-21 DIAGNOSIS — M7072 Other bursitis of hip, left hip: Secondary | ICD-10-CM

## 2015-12-21 DIAGNOSIS — M1710 Unilateral primary osteoarthritis, unspecified knee: Secondary | ICD-10-CM

## 2015-12-21 NOTE — Progress Notes (Signed)
Procedure note injection for   Left  hip bursitis  Verbal consent was obtained for injection of the  Left   hip  Timeout was completed to confirm the injection site  The medications used were 40 mg of Depo-Medrol and 1% lidocaine 3 cc  Anesthesia was provided by ethyl chloride and the skin was prepped with alcohol.  After cleaning the skin with alcohol a 25-gauge needle was used to inject the  left bursa of the hip  We also saw her today for a issue with her left knee so this is a separate note. Chief complaint pain left knee. Chronic pain for several weeks now. Constant. Dull ache. Medial and lateral joint line. Associated symptoms denies catching locking giving way  Review of systems chronic pain, some radicular symptoms in the left leg  Left knee medial and lateral joint line tenderness. Knee flexion range of motion 120. All ligaments are stable. Muscle strength and tone is normal the skin is intact her left leg is warm to touch and there is normal sensation there. She is able to 3 with a cane. She is getting more and more confused at each visit having some type of cream and hallucinations where she thinks she is the son/mother Dorinda Hill Trump  Left knee arthritis  Inject left knee  Procedure note left knee injection verbal consent was obtained to inject left knee joint  Timeout was completed to confirm the site of injection  The medications used were 40 mg of Depo-Medrol and 1% lidocaine 3 cc  Anesthesia was provided by ethyl chloride and the skin was prepped with alcohol.  After cleaning the skin with alcohol a 20-gauge needle was used to inject the left knee joint. There were no complications. A sterile bandage was applied.

## 2016-01-18 DIAGNOSIS — M2550 Pain in unspecified joint: Secondary | ICD-10-CM | POA: Diagnosis not present

## 2016-01-18 DIAGNOSIS — M545 Low back pain: Secondary | ICD-10-CM | POA: Diagnosis not present

## 2016-01-18 DIAGNOSIS — R5382 Chronic fatigue, unspecified: Secondary | ICD-10-CM | POA: Diagnosis not present

## 2016-02-24 DIAGNOSIS — E039 Hypothyroidism, unspecified: Secondary | ICD-10-CM | POA: Diagnosis not present

## 2016-02-24 DIAGNOSIS — M2550 Pain in unspecified joint: Secondary | ICD-10-CM | POA: Diagnosis not present

## 2016-02-24 DIAGNOSIS — M545 Low back pain: Secondary | ICD-10-CM | POA: Diagnosis not present

## 2016-03-21 ENCOUNTER — Ambulatory Visit: Payer: Self-pay | Admitting: Orthopedic Surgery

## 2016-03-23 ENCOUNTER — Ambulatory Visit (INDEPENDENT_AMBULATORY_CARE_PROVIDER_SITE_OTHER): Payer: PPO | Admitting: Orthopedic Surgery

## 2016-03-23 VITALS — BP 160/95 | HR 90 | Ht 67.0 in | Wt 224.0 lb

## 2016-03-23 DIAGNOSIS — M5432 Sciatica, left side: Secondary | ICD-10-CM

## 2016-03-23 NOTE — Progress Notes (Signed)
Requests IM injection left hip   Also c/o back pain; had L spine surgery Dr Ophelia CharterYates > 5 years ago.  No leg symptoms  Procedure note injection for Left  hip pain/possible radiation from the abck  Verbal consent was obtained for injection of the  Left IM  hip  Timeout was completed to confirm the injection site  The medications used were 40 mg of Depo-Medrol and 1% lidocaine 3 cc  Anesthesia was provided by ethyl chloride and the skin was prepped with alcohol.  After cleaning the skin with alcohol a 25-gauge needle was used to inject the  Left butock

## 2016-03-29 DIAGNOSIS — M545 Low back pain: Secondary | ICD-10-CM | POA: Diagnosis not present

## 2016-03-29 DIAGNOSIS — I110 Hypertensive heart disease with heart failure: Secondary | ICD-10-CM | POA: Diagnosis not present

## 2016-03-29 DIAGNOSIS — M2550 Pain in unspecified joint: Secondary | ICD-10-CM | POA: Diagnosis not present

## 2016-03-29 DIAGNOSIS — E662 Morbid (severe) obesity with alveolar hypoventilation: Secondary | ICD-10-CM | POA: Diagnosis not present

## 2016-04-12 ENCOUNTER — Encounter (HOSPITAL_COMMUNITY): Payer: Self-pay | Admitting: Psychiatry

## 2016-04-12 ENCOUNTER — Ambulatory Visit (INDEPENDENT_AMBULATORY_CARE_PROVIDER_SITE_OTHER): Payer: PPO | Admitting: Psychiatry

## 2016-04-12 VITALS — BP 140/85 | HR 78 | Ht 67.0 in | Wt 224.6 lb

## 2016-04-12 DIAGNOSIS — F200 Paranoid schizophrenia: Secondary | ICD-10-CM | POA: Diagnosis not present

## 2016-04-12 DIAGNOSIS — F203 Undifferentiated schizophrenia: Secondary | ICD-10-CM

## 2016-04-12 DIAGNOSIS — F202 Catatonic schizophrenia: Secondary | ICD-10-CM

## 2016-04-12 DIAGNOSIS — R5383 Other fatigue: Secondary | ICD-10-CM | POA: Diagnosis not present

## 2016-04-12 MED ORDER — PERPHENAZINE 8 MG PO TABS: 16.0000 mg | ORAL_TABLET | Freq: Every day | ORAL | Status: DC

## 2016-04-12 NOTE — Progress Notes (Signed)
Patient ID: Sherry Navarro, female   DOB: 11/17/1940, 75 y.o.   MRN: [id number] Patient ID: Sherry Navarro, female   DOB: 10/26/1941, 75 y.o.   MRN: [id number] Patient ID: Sherry Navarro, female   DOB: 04/23/1941, 75 y.o.   MRN: [id number] Patient ID: Sherry Navarro, female   DOB: 12/25/1940, 75 y.o.   MRN: [id number] Patient ID: Sherry Navarro, female   DOB: 08/26/1941, 75 y.o.   MRN: [id number] Patient ID: Sherry Navarro, female   DOB: 08/26/1941, 75 y.o.   MRN: [id number] Patient ID: Sherry Navarro, female   DOB: 02/23/1941, 75 y.o.   MRN: [id number] Patient ID: Sherry Navarro, female   DOB: 08/04/1941, 75 y.o.   MRN: [id number] Patient ID: Sherry Navarro, female   DOB: 08/30/1941, 75 y.o.   MRN: [id number] Patient ID: Sherry Navarro, female   DOB: 07/13/1941, 75 y.o.   MRN: [id number] Patient ID: Sherry Navarro Paulhus, female   DOB: 10/16/1941, 75 y.o.   MRN: [id number] [facility] [id number] Progress Note Sherry Navarro Casimir MRN: [id number] DOB: 01/22/1941 Age: 75 y.o.  Date: 04/12/2016 Start Time: 2:15 PM End Time: 2:35 PM  Chief Complaint: Chief Complaint  Patient presents with  . Schizophrenia  . Follow-up   Subjective: "I'm doing pretty good".   This patient is a [age] year old widowed white female who lives with her sister in [place]. She used to work as a Tree surgeonbeautician  The patient states that she has had mental illness since her early 8320s. She was not married to a man was running around on her and she had a breakdown and was diagnosed with schizophrenia. We don't have any documentation of this. She was on Navane for years but it was no longer being manufactured to several months ago she was put on perphenazine 16 mg per day. She has terrible shaking in her hands and some in her feet which is probably secondary to these neuroleptic medications. She claims she had this shaking even before she was on medicine but that it's difficult to believe. She claims that she tries to get off these medicines she gets terrible  headaches but I think we need to try given her shakiness. She claims she's tried Inderal Cogentin and benzodiazepines but nothing has helped.  Currently she has no paranoia auditory or visual hallucinations depression or suicidal ideation. She her sister go shopping every day and ought to be and she seems happy with her current lifestyle  The patient returns after 3 months. She states that for the most part she is doing well. Her hand still shake but she states it "goes in spells." He has been feeling more tired recently and her doctor is going to check her thyroid labs. She doesn't want to change her antipsychotic because she feels good and does not have delusions hallucinations or paranoia despite this shaking in her hands. Her mood is good and she is sleeping well and eating well. Current psychiatric medication Perphenazine 16 mg each bedtime  Vitals: BP 140/85 mmHg  Pulse 78  Ht 5\' 7"  (1.702 m)  Wt 224 lb 9.6 oz (101.878 kg)  BMI 35.17 kg/m2  SpO2 97%  Allergies: Allergies  Allergen Reactions  . Thorazine [Chlorpromazine] Other (See Comments)    seizures   Medical History: Past Medical History  Diagnosis Date  . High cholesterol   . Osteoarthritis   . Thyroid disorder   . AC (acromioclavicular) joint bone spurs     on hip  joints   . Hiatal hernia   . Schizophrenia (HCC)   . Seizures (HCC)   The patient has history of hyperlipidemia, Oster arthritis, hypothyroidism and hiatal hernia.  Her primary care physician is Dr. Lorrene Reid.  Surgical History: Past Surgical History  Procedure Laterality Date  . 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 Romeo Apple    . Rt knee replacement not by dr. Romeo Apple    . Cholecystectomy     Family History: family history includes Depression in her other. There is no history of Schizophrenia, ADD / ADHD, Alcohol abuse, Drug abuse, Anxiety disorder, Bipolar disorder, Dementia, OCD, Paranoid behavior, Seizures, Sexual abuse, or Physical  abuse. Reviewed and nothing is new today.  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  She has been admitted at least 3 times for psychosis and delusional behavior.  She is been stable on her current psychiatric medication for a long time and is scared to change them.  Psychosocial history Patient lives with her sister.  She has one son who lives in [place].  Mental status examination Patient is casually dressed and fairly groomed.She is elderly woman who appears to be in her stated age.  She maintained fair eye contact.  She is tremulous in both hands  She maintained fair eye contact. Her speech is a less pressured. She is clear and understandable She denies any active or passive suicidal thinking or homicidal thinking. She described her mood is somewhat good but no longer manic and her affect is mood  She has has not mentioned any delusions and denies auditory or visual hallucinations today Her attention and concentration is fair. She's alert and oriented x3.  There are some tremors present in her hands but they are chronic in nature. Her insight judgment and impulse control is okay.  Lab Results:  No results found for this or any previous visit (from the past 8736 hour(s)).PCP draws labs on regular basis.  No concerns are noted, except elevated cholesterol.  Assessment Axis I Schizophrenia chronic paranoid type Axis II deferred Axis III see medical history, also add antipsychotic-induced tardive dyskinesia Axis IV mild to moderate  Plan/Discussion: I took her vitals.  I reviewed CC, tobacco/med/surg Hx, meds effects/ side effects, problem list, therapies and responses as well as current situation/symptoms discussed options.  She will continue perphenazine 16 mg per day for schizophrenia. She will return in 4 months  MEDICATIONS this encounter: Meds ordered this encounter  Medications  . traMADol (ULTRAM) 50 MG tablet    Sig: Take 50 mg  by mouth every 6 (six) hours as needed.  Marland Kitchen perphenazine (TRILAFON) 8 MG tablet    Sig: Take 2 tablets (16 mg total) by mouth at bedtime.    Dispense:  60 tablet    Refill:  3   Medical Decision Making Problem Points:  Established problem, stable/improving (1), New problem, with no additional work-up planned (3), Review of last therapy session (1) and Review of psycho-social stressors (1) Data Points:  Review or order clinical lab tests (1) Review of medication regiment & side effects (2) Review of new medications or change in dosage (2)  I certify that outpatient services furnished can reasonably be expected to improve the patient's condition.   Diannia Ruder, MD

## 2016-06-01 DIAGNOSIS — H2513 Age-related nuclear cataract, bilateral: Secondary | ICD-10-CM | POA: Diagnosis not present

## 2016-06-07 DIAGNOSIS — E039 Hypothyroidism, unspecified: Secondary | ICD-10-CM | POA: Diagnosis not present

## 2016-06-07 DIAGNOSIS — M159 Polyosteoarthritis, unspecified: Secondary | ICD-10-CM | POA: Diagnosis not present

## 2016-06-07 DIAGNOSIS — F209 Schizophrenia, unspecified: Secondary | ICD-10-CM | POA: Diagnosis not present

## 2016-06-15 DIAGNOSIS — E039 Hypothyroidism, unspecified: Secondary | ICD-10-CM | POA: Diagnosis not present

## 2016-06-15 DIAGNOSIS — E559 Vitamin D deficiency, unspecified: Secondary | ICD-10-CM | POA: Diagnosis not present

## 2016-06-15 DIAGNOSIS — M159 Polyosteoarthritis, unspecified: Secondary | ICD-10-CM | POA: Diagnosis not present

## 2016-06-15 DIAGNOSIS — F209 Schizophrenia, unspecified: Secondary | ICD-10-CM | POA: Diagnosis not present

## 2016-06-22 ENCOUNTER — Ambulatory Visit: Payer: Self-pay | Admitting: Orthopedic Surgery

## 2016-06-29 DIAGNOSIS — B370 Candidal stomatitis: Secondary | ICD-10-CM | POA: Diagnosis not present

## 2016-06-29 DIAGNOSIS — K219 Gastro-esophageal reflux disease without esophagitis: Secondary | ICD-10-CM | POA: Diagnosis not present

## 2016-07-12 ENCOUNTER — Ambulatory Visit: Payer: Self-pay | Admitting: Orthopedic Surgery

## 2016-07-18 ENCOUNTER — Ambulatory Visit (INDEPENDENT_AMBULATORY_CARE_PROVIDER_SITE_OTHER): Payer: PPO | Admitting: Orthopedic Surgery

## 2016-07-18 DIAGNOSIS — M7072 Other bursitis of hip, left hip: Secondary | ICD-10-CM | POA: Diagnosis not present

## 2016-07-18 NOTE — Patient Instructions (Signed)

## 2016-07-18 NOTE — Progress Notes (Signed)
A steroid injection was performed at left im buttocks  using 1% plain Lidocaine and 40 mg of depomedrol. This was well tolerated.

## 2016-07-19 DIAGNOSIS — M159 Polyosteoarthritis, unspecified: Secondary | ICD-10-CM | POA: Diagnosis not present

## 2016-07-19 DIAGNOSIS — K219 Gastro-esophageal reflux disease without esophagitis: Secondary | ICD-10-CM | POA: Diagnosis not present

## 2016-07-19 DIAGNOSIS — E559 Vitamin D deficiency, unspecified: Secondary | ICD-10-CM | POA: Diagnosis not present

## 2016-07-19 DIAGNOSIS — E039 Hypothyroidism, unspecified: Secondary | ICD-10-CM | POA: Diagnosis not present

## 2016-07-19 DIAGNOSIS — R5383 Other fatigue: Secondary | ICD-10-CM | POA: Diagnosis not present

## 2016-08-01 ENCOUNTER — Ambulatory Visit (INDEPENDENT_AMBULATORY_CARE_PROVIDER_SITE_OTHER): Payer: PPO | Admitting: Otolaryngology

## 2016-08-01 DIAGNOSIS — K122 Cellulitis and abscess of mouth: Secondary | ICD-10-CM

## 2016-08-10 ENCOUNTER — Ambulatory Visit (INDEPENDENT_AMBULATORY_CARE_PROVIDER_SITE_OTHER): Payer: PPO | Admitting: Psychiatry

## 2016-08-10 ENCOUNTER — Encounter (HOSPITAL_COMMUNITY): Payer: Self-pay | Admitting: Psychiatry

## 2016-08-10 DIAGNOSIS — F200 Paranoid schizophrenia: Secondary | ICD-10-CM | POA: Diagnosis not present

## 2016-08-10 DIAGNOSIS — F202 Catatonic schizophrenia: Secondary | ICD-10-CM

## 2016-08-10 MED ORDER — VALBENAZINE TOSYLATE 40 MG PO CAPS: 80.0000 mg | ORAL_CAPSULE | Freq: Every day | ORAL | 2 refills | Status: DC

## 2016-08-10 MED ORDER — PERPHENAZINE 8 MG PO TABS: 16.0000 mg | ORAL_TABLET | Freq: Every day | ORAL | 3 refills | Status: DC

## 2016-08-10 NOTE — Progress Notes (Signed)
Patient ID: HARUMI YAMIN, female   DOB: 1941/03/11, 75 y.o.   MRN: [id number] Patient ID: MYLINH CRAGG, female   DOB: 05/20/1941, 75 y.o.   MRN: [id number] Patient ID: MARDI CANNADY, female   DOB: Apr 27, 1941, 75 y.o.   MRN: [id number] Patient ID: PATSIE MCCARDLE, female   DOB: 06-18-1941, 75 y.o.   MRN: [id number] Patient ID: TAMEKA HOILAND, female   DOB: Aug 20, 1941, 75 y.o.   MRN: [id number] Patient ID: CHENEL WERNLI, female   DOB: 09-16-1941, 75 y.o.   MRN: [id number] Patient ID: NATASHA BURDA, female   DOB: [birth year], 75 y.o.   MRN: [id number] Patient ID: MADILYNN MONTANTE, female   DOB: 29-Oct-1941, 75 y.o.   MRN: [id number] Patient ID: TAKAYA HYSLOP, female   DOB: 08/19/1941, 75 y.o.   MRN: [id number] Patient ID: AHNIYA MITCHUM, female   DOB: 1941/01/10, 75 y.o.   MRN: [id number] Patient ID: MCKENZI BUONOMO, female   DOB: Aug 29, 1941, 75 y.o.   MRN: [id number] [facility] [address] Progress Note TYERRA LORETTO MRN: [id number] DOB: May 07, 1941 Age: 75 y.o.  Date: 08/10/2016 Start Time: 2:15 PM End Time: 2:35 PM  Chief Complaint: Chief Complaint  Patient presents with  . Schizophrenia  . Follow-up   Subjective: "I'm doing pretty good".   This patient is a 75 year old widowed white female who lives with her sister in [place]. She used to work as a Tree surgeon  The patient states that she has had mental illness since her early 22s. She was not married to a man was running around on her and she had a breakdown and was diagnosed with schizophrenia. We don't have any documentation of this. She was on Navane for years but it was no longer being manufactured to several months ago she was put on perphenazine 16 mg per day. She has terrible shaking in her hands and some in her feet which is probably secondary to these neuroleptic medications. She claims she had this shaking even before she was on medicine but that it's difficult to believe. She claims that she tries to get off these medicines she gets terrible  headaches but I think we need to try given her shakiness. She claims she's tried Inderal Cogentin and benzodiazepines but nothing has helped.  Currently she has no paranoia auditory or visual hallucinations depression or suicidal ideation. She her sister go shopping every day and ought to be and she seems happy with her current lifestyle  The patient returns after 3 months. She states that for the most part she is doing okay. She worries a lot about her finances. However she denies being depressed or suicidal and does not have any auditory or visual hallucinations. She still has odd delusions such as thinking she has lived in past lives but she can put these aside and live in this life without difficulty. She still has tremendous problems with tremor in her hands. I told her there are some new medications out for tardive dyskinesia and she states she is willing to try this if it would cost her too much. She is unwilling to change the perphenazine Current psychiatric medication Perphenazine 16 mg each bedtime  Vitals: BP (!) 143/89   Pulse 74   Ht 5\' 7"  (1.702 m)   Wt 222 lb 6.4 oz (100.9 kg)   BMI 34.83 kg/m   Allergies: Allergies  Allergen Reactions  . Thorazine [Chlorpromazine] Other (See Comments)    seizures   Medical History: Past  Medical History:  Diagnosis Date  . AC (acromioclavicular) joint bone spurs    on hip joints   . Hiatal hernia   . High cholesterol   . Osteoarthritis   . Schizophrenia (HCC)   . Seizures (HCC)   . Thyroid disorder   The patient has history of hyperlipidemia, Oster arthritis, hypothyroidism and hiatal hernia.  Her primary care physician is Dr. Lorrene Reidana Diego.  Surgical History: Past Surgical History:  Procedure Laterality Date  . back for spinal stenonsis not by Dr. [name]    . CHOLECYSTECTOMY    . rt knee replacement not by Dr. [name]     Family History: family history includes Depression in her other. Reviewed and nothing is new  today.  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  She has been admitted at least 3 times for psychosis and delusional behavior.  She is been stable on her current psychiatric medication for a long time and is scared to change them.  Psychosocial history Patient lives with her sister.  She has one son who lives in [place].  Mental status examination Patient is casually dressed and fairly groomed.She is elderly woman who appears to be in her stated age.  She maintained fair eye contact.  She is tremulous in both hands  She maintained fair eye contact. Her speech is a less pressured. She is clear and understandable She denies any active or passive suicidal thinking or homicidal thinking. She described her mood is somewhat good but no longer manic and her affect is mood  She has has not mentioned any delusions and denies auditory or visual hallucinations today Her attention and concentration is fair. She's alert and oriented x3.  There are some tremors present in her hands but they are chronic in nature. Her insight judgment and impulse control is okay.  Lab Results:  No results found for this or any previous visit (from the past 8736 hour(s)).PCP draws labs on regular basis.  No concerns are noted, except elevated cholesterol.  Assessment Axis I Schizophrenia chronic paranoid type Axis II deferred Axis III see medical history, also add antipsychotic-induced tardive dyskinesia Axis IV mild to moderate  Plan/Discussion: I took her vitals.  I reviewed CC, tobacco/med/surg Hx, meds effects/ side effects, problem list, therapies and responses as well as current situation/symptoms discussed options.  She will continue perphenazine 16 mg per day for schizophrenia. We will try to get Ingrezza 80 mg at bedtime approved for her tardive dyskinesia She will return in 4 months  MEDICATIONS this encounter: Meds ordered this encounter  Medications  .  PREDNISONE PO    Sig: Take by mouth daily.  Marland Kitchen. perphenazine (TRILAFON) 8 MG tablet    Sig: Take 2 tablets (16 mg total) by mouth at bedtime.    Dispense:  60 tablet    Refill:  3  . Valbenazine Tosylate (INGREZZA) 40 MG CAPS    Sig: Take 80 mg by mouth at bedtime.    Dispense:  180 capsule    Refill:  2   Medical Decision Making Problem Points:  Established problem, stable/improving (1), New problem, with no additional work-up planned (3), Review of last therapy session (1) and Review of psycho-social stressors (1) Data Points:  Review or order clinical lab tests (1) Review of medication regiment & side effects (2) Review of new medications or change in dosage (2)  I certify that outpatient services furnished can reasonably be expected to improve the patient's condition.  Diannia Ruder, MDPatient ID: Marti Sleigh, female   DOB: Jul 25, 1941, 75 y.o.   MRN: [id number]

## 2016-08-15 ENCOUNTER — Ambulatory Visit (INDEPENDENT_AMBULATORY_CARE_PROVIDER_SITE_OTHER): Payer: PPO | Admitting: Otolaryngology

## 2016-08-15 DIAGNOSIS — L439 Lichen planus, unspecified: Secondary | ICD-10-CM

## 2016-08-29 ENCOUNTER — Telehealth (HOSPITAL_COMMUNITY): Payer: Self-pay | Admitting: *Deleted

## 2016-08-29 NOTE — Telephone Encounter (Signed)
Pt called to check on the status of her PA for Ingrezza.. Called pt insurance to get more information at [phone] and spoke with [place] in the pharmacy department. Per Suzette BattiestVeronica, the Allena Earingngrezza is a non formulary drug that is not part of pt plan. Per Suzette BattiestVeronica, a PA is needed. Asked Suzette BattiestVeronica if office could do PA over the phone and she stated yes but they will have to call office back anywhere from 2-3 hours from now. Per Suzette BattiestVeronica she can fax PA form to office and office fax number was provided. Per Suzette BattiestVeronica, office should be expecting fax within 2-3 hours from now and staff verbalized understanding.

## 2016-08-29 NOTE — Telephone Encounter (Signed)
Office received PA fax from pt insurance and form was completed and provider signed form. Office faxed form back to insurance and put on there for form to be expedited. Called pt and informed her with update and pt verify understanding

## 2016-08-30 ENCOUNTER — Telehealth (HOSPITAL_COMMUNITY): Payer: Self-pay | Admitting: *Deleted

## 2016-08-30 NOTE — Telephone Encounter (Signed)
Office received denial letter for pt Ingrezza from pt insurance/[facility]. Per pt insurance [facility] : [facility]([employer]), information does not meet the plan's criteria for approval. Per pt insurance, the requested drug is not on the plan's formulary. Per denial, the formulary alternatives for the indicated diagnosis which are covered by this plan includes tetrabenazine 12.5mg  and tetrabenazine 25 mg. Per denial, [facility] denied the request under medicare Part D because they need more supporting documentation AND indications that pt tried formulary drug indicated and it should be in documentation as well. [facility].

## 2016-08-30 NOTE — Telephone Encounter (Signed)
Call the drug rep for ingrezza, they are supposed to help with this

## 2016-08-31 DIAGNOSIS — B370 Candidal stomatitis: Secondary | ICD-10-CM | POA: Diagnosis not present

## 2016-08-31 DIAGNOSIS — E039 Hypothyroidism, unspecified: Secondary | ICD-10-CM | POA: Diagnosis not present

## 2016-08-31 DIAGNOSIS — E559 Vitamin D deficiency, unspecified: Secondary | ICD-10-CM | POA: Diagnosis not present

## 2016-09-01 NOTE — Telephone Encounter (Signed)
Hold off for now, she wouldn't take it

## 2016-09-01 NOTE — Telephone Encounter (Signed)
Spoke with Rep and she recommended that office do the appeal due to the dosing regiment and medication having a black box warning. Per rep, if office had to assure pt to try Ingrezza and is switched to the medication her insurance wants her to take, she will most likely see on the front part of Tetrabenazine that is has a black box warning and other side effects. Please advice with next step

## 2016-09-05 ENCOUNTER — Telehealth (HOSPITAL_COMMUNITY): Payer: Self-pay | Admitting: *Deleted

## 2016-09-05 NOTE — Telephone Encounter (Signed)
Spoke with pt

## 2016-09-05 NOTE — Telephone Encounter (Signed)
noted 

## 2016-09-05 NOTE — Telephone Encounter (Signed)
phone call from patient, she would like Sherry Navarro to call her regarding her denial for her medications.

## 2016-09-05 NOTE — Telephone Encounter (Signed)
Called pt and informed her with what provider stated and she verbalized understanding.  

## 2016-09-07 ENCOUNTER — Telehealth (HOSPITAL_COMMUNITY): Payer: Self-pay | Admitting: *Deleted

## 2016-09-07 NOTE — Telephone Encounter (Signed)
Ingrezza was denied per [facility] stating it is not on the plans's formulary to treat the indicated diagnosis. One formulary has been approved by the FDA and must be tried and failed; Tetrabenazine 12.5mg  and 25mg . A supporting statement can be submitted explaining why the formulary is not appropriate for the patient to get it approved.

## 2016-09-12 ENCOUNTER — Telehealth (HOSPITAL_COMMUNITY): Payer: Self-pay | Admitting: *Deleted

## 2016-09-12 NOTE — Telephone Encounter (Signed)
[place] [facility], ref.# [id number].  Has questions regarding time sensitive information.

## 2016-09-13 NOTE — Telephone Encounter (Signed)
Provider would like to hold off on this for right now. Pt is aware.

## 2016-09-13 NOTE — Telephone Encounter (Signed)
Provider would like to hold off on this for right now and pt is aware.

## 2016-09-13 NOTE — Telephone Encounter (Signed)
noted 

## 2016-09-26 ENCOUNTER — Ambulatory Visit (INDEPENDENT_AMBULATORY_CARE_PROVIDER_SITE_OTHER): Payer: PPO | Admitting: Otolaryngology

## 2016-09-26 DIAGNOSIS — K122 Cellulitis and abscess of mouth: Secondary | ICD-10-CM

## 2016-10-11 DIAGNOSIS — R5383 Other fatigue: Secondary | ICD-10-CM | POA: Diagnosis not present

## 2016-10-11 DIAGNOSIS — K219 Gastro-esophageal reflux disease without esophagitis: Secondary | ICD-10-CM | POA: Diagnosis not present

## 2016-10-11 DIAGNOSIS — E039 Hypothyroidism, unspecified: Secondary | ICD-10-CM | POA: Diagnosis not present

## 2016-10-11 DIAGNOSIS — E559 Vitamin D deficiency, unspecified: Secondary | ICD-10-CM | POA: Diagnosis not present

## 2016-10-18 ENCOUNTER — Ambulatory Visit: Payer: Self-pay | Admitting: Orthopedic Surgery

## 2016-11-08 ENCOUNTER — Ambulatory Visit (INDEPENDENT_AMBULATORY_CARE_PROVIDER_SITE_OTHER): Payer: PPO | Admitting: Orthopedic Surgery

## 2016-11-08 DIAGNOSIS — M25552 Pain in left hip: Secondary | ICD-10-CM | POA: Diagnosis not present

## 2016-11-08 DIAGNOSIS — G8929 Other chronic pain: Secondary | ICD-10-CM

## 2016-11-08 DIAGNOSIS — G894 Chronic pain syndrome: Secondary | ICD-10-CM

## 2016-11-08 NOTE — Progress Notes (Signed)
Patient returns today for injection in her left hip  She has long-term sciatic after lumbar disc surgery and [facility] [place] injections [place] [facility] [place] [facility]  She comes in every 3 months that she gets good relief from the IM shot of Depo-Medrol  IM shot of Depo-Medrol repeated  Verbal consent was given timeout was taken and completed 40 mg of Depo-Medrol injected the left gluteal area IM injection given  Encounter Diagnoses  Name Primary?  . Chronic pain syndrome   . Chronic left hip pain Yes     Follow-up 3 months

## 2016-11-23 DIAGNOSIS — I10 Essential (primary) hypertension: Secondary | ICD-10-CM | POA: Diagnosis not present

## 2016-11-23 DIAGNOSIS — K219 Gastro-esophageal reflux disease without esophagitis: Secondary | ICD-10-CM | POA: Diagnosis not present

## 2016-11-23 DIAGNOSIS — F209 Schizophrenia, unspecified: Secondary | ICD-10-CM | POA: Diagnosis not present

## 2016-11-23 DIAGNOSIS — R110 Nausea: Secondary | ICD-10-CM | POA: Diagnosis not present

## 2016-11-24 ENCOUNTER — Telehealth (HOSPITAL_COMMUNITY): Payer: Self-pay | Admitting: *Deleted

## 2016-11-24 NOTE — Telephone Encounter (Signed)
voice message regarding prior auth.

## 2016-11-29 NOTE — Telephone Encounter (Signed)
Per Dr. Tenny Crawoss pt do not need to get Ingrezza at this time so no PA is needed at this time.

## 2016-12-05 ENCOUNTER — Other Ambulatory Visit (HOSPITAL_COMMUNITY): Payer: Self-pay | Admitting: Psychiatry

## 2016-12-05 ENCOUNTER — Telehealth (HOSPITAL_COMMUNITY): Payer: Self-pay | Admitting: *Deleted

## 2016-12-05 DIAGNOSIS — F202 Catatonic schizophrenia: Secondary | ICD-10-CM

## 2016-12-05 MED ORDER — PERPHENAZINE 8 MG PO TABS: 16.0000 mg | ORAL_TABLET | Freq: Every day | ORAL | 3 refills | Status: DC

## 2016-12-05 NOTE — Telephone Encounter (Signed)
Trilafon sent but she still needs an appt

## 2016-12-05 NOTE — Telephone Encounter (Signed)
PATIENT CANCELED, SAID SHE IS NOT FEELING WELL, SAID SHE WILL CALL BACK TO RESCHEDULE.

## 2016-12-05 NOTE — Telephone Encounter (Signed)
PATIENT CANCELED APPOINTMENT FOR 12/06/16.   SAID SHE IS NOT FEELING WELL.   PATIENT CALLED BACK, SAID HER SISTER DON'T WANT TO BRING HER TO APPOINTMENT.   SHE SAID SHE CAN NOT RESCHEDULE AT THIS TIME, BUT SHE NEED HER REFILL FOR HER MEDS.

## 2016-12-06 ENCOUNTER — Ambulatory Visit (HOSPITAL_COMMUNITY): Payer: Self-pay | Admitting: Psychiatry

## 2016-12-06 NOTE — Telephone Encounter (Signed)
Called pt and phone rang for a while and message stated to try call later and call was disconnected.

## 2016-12-09 NOTE — Telephone Encounter (Signed)
Pt have appt for 12-27-2016

## 2016-12-13 DIAGNOSIS — I10 Essential (primary) hypertension: Secondary | ICD-10-CM | POA: Diagnosis not present

## 2016-12-13 DIAGNOSIS — E039 Hypothyroidism, unspecified: Secondary | ICD-10-CM | POA: Diagnosis not present

## 2016-12-27 ENCOUNTER — Ambulatory Visit (INDEPENDENT_AMBULATORY_CARE_PROVIDER_SITE_OTHER): Payer: PPO | Admitting: Psychiatry

## 2016-12-27 ENCOUNTER — Encounter (HOSPITAL_COMMUNITY): Payer: Self-pay | Admitting: Psychiatry

## 2016-12-27 VITALS — BP 152/86 | HR 85 | Ht 67.0 in | Wt 227.0 lb

## 2016-12-27 DIAGNOSIS — F203 Undifferentiated schizophrenia: Secondary | ICD-10-CM | POA: Diagnosis not present

## 2016-12-27 DIAGNOSIS — Z888 Allergy status to other drugs, medicaments and biological substances status: Secondary | ICD-10-CM

## 2016-12-27 DIAGNOSIS — F202 Catatonic schizophrenia: Secondary | ICD-10-CM | POA: Diagnosis not present

## 2016-12-27 MED ORDER — PERPHENAZINE 8 MG PO TABS: 16.0000 mg | ORAL_TABLET | Freq: Every day | ORAL | 3 refills | Status: DC

## 2016-12-27 MED ORDER — ESCITALOPRAM OXALATE 10 MG PO TABS: 10.0000 mg | ORAL_TABLET | Freq: Every day | ORAL | 2 refills | Status: DC
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
## 2016-12-28 ENCOUNTER — Telehealth (HOSPITAL_COMMUNITY): Payer: Self-pay | Admitting: *Deleted

## 2016-12-28 NOTE — Telephone Encounter (Signed)
Spoke with pt and she stated she is not taking her Metoprolol due to it have so many bad side effects and she would have to go to the doctor if something goes wrong. Per pt she did not want to go through that.

## 2017-01-17 DIAGNOSIS — I10 Essential (primary) hypertension: Secondary | ICD-10-CM | POA: Diagnosis not present

## 2017-01-26 ENCOUNTER — Telehealth (HOSPITAL_COMMUNITY): Payer: Self-pay | Admitting: *Deleted

## 2017-01-26 NOTE — Telephone Encounter (Signed)
[facility] [place] [facility], she said patient has been crying about her medication, she said patient don't know the name of the medication, but something about prior approval.   Please call them with name of the medication.

## 2017-01-30 NOTE — Telephone Encounter (Signed)
Medication management - Telephone call with [employer] with Suzette Battiest to follow up on patient's request for formulary acception for her to begin receiving Ingrezza , 2 at bedtime for her tardive dyskinesia.  Arranged for forms to be faxed to our office for completion and called patient to inform these would be completed and returned to [employer] to try to get her Ingrezza covered.  Informed this could take at least 72 hours from the time our forms are faxed back to them and patient agreed with plan.

## 2017-01-31 NOTE — Telephone Encounter (Signed)
noted 

## 2017-02-02 ENCOUNTER — Telehealth (HOSPITAL_COMMUNITY): Payer: Self-pay | Admitting: *Deleted

## 2017-02-02 NOTE — Telephone Encounter (Signed)
Filled out paperwork for prior authorization of Ingrezza and faxed to office for MD signature and to fax back to [facility]

## 2017-02-06 ENCOUNTER — Ambulatory Visit (INDEPENDENT_AMBULATORY_CARE_PROVIDER_SITE_OTHER): Payer: PPO | Admitting: Orthopedic Surgery

## 2017-02-06 ENCOUNTER — Encounter: Payer: Self-pay | Admitting: Orthopedic Surgery

## 2017-02-06 ENCOUNTER — Telehealth (HOSPITAL_COMMUNITY): Payer: Self-pay | Admitting: *Deleted

## 2017-02-06 DIAGNOSIS — G8929 Other chronic pain: Secondary | ICD-10-CM

## 2017-02-06 DIAGNOSIS — M25552 Pain in left hip: Secondary | ICD-10-CM

## 2017-02-06 DIAGNOSIS — G894 Chronic pain syndrome: Secondary | ICD-10-CM

## 2017-02-06 NOTE — Progress Notes (Signed)
Chief Complaint  Patient presents with  . Follow-up    LEFT HIP INJECTION    Procedure note injection for LEFT HIP  Verbal consent was obtained for injection of the LEFT HIP  hip  Timeout was completed to confirm the injection site  The medications used were 40 mg of Depo-Medrol and 1% lidocaine 3 cc  Anesthesia was provided by ethyl chloride and the skin was prepped with alcohol.  After cleaning the skin with alcohol a 25-gauge needle was used to inject the  LEFT  BUTTOCK OF THE HIP

## 2017-02-06 NOTE — Telephone Encounter (Signed)
Office received fax from [employer]  With Coverage Determination request status for pt Ingrezza 40 mg Capsule. Per fax, request was approved for pt from 01-31-2017 until 10-30-2017.

## 2017-02-07 ENCOUNTER — Encounter (HOSPITAL_COMMUNITY): Payer: Self-pay | Admitting: Psychiatry

## 2017-02-07 ENCOUNTER — Ambulatory Visit (INDEPENDENT_AMBULATORY_CARE_PROVIDER_SITE_OTHER): Payer: PPO | Admitting: Psychiatry

## 2017-02-07 VITALS — BP 150/88 | HR 81 | Ht 67.0 in | Wt 227.0 lb

## 2017-02-07 DIAGNOSIS — Z818 Family history of other mental and behavioral disorders: Secondary | ICD-10-CM | POA: Diagnosis not present

## 2017-02-07 DIAGNOSIS — F200 Paranoid schizophrenia: Secondary | ICD-10-CM

## 2017-02-07 DIAGNOSIS — F203 Undifferentiated schizophrenia: Secondary | ICD-10-CM

## 2017-02-07 DIAGNOSIS — F202 Catatonic schizophrenia: Secondary | ICD-10-CM

## 2017-02-07 MED ORDER — PERPHENAZINE 8 MG PO TABS: 16.0000 mg | ORAL_TABLET | Freq: Every day | ORAL | 3 refills | Status: DC
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
## 2017-02-20 ENCOUNTER — Telehealth (HOSPITAL_COMMUNITY): Payer: Self-pay | Admitting: *Deleted

## 2017-02-20 NOTE — Telephone Encounter (Signed)
It has nothing to do with allergies and she should take it

## 2017-02-20 NOTE — Telephone Encounter (Signed)
Pt called stating she have a really bad headache and have an allergy. Per pt she did receive her Ingrezza. Per pt she would like to know if her insurance paid for it. RMA informed pt her insurance did pay for it. Per pt she have really back seasonal allergies right and really back headaches and she don't think she wants to take the medication. Per pt, she don't feel like taking the Ingrezza right now although she just received it.

## 2017-02-20 NOTE — Telephone Encounter (Signed)
Informed pt with information and she stated she is not ready to take this medication yet.

## 2017-02-27 ENCOUNTER — Telehealth (HOSPITAL_COMMUNITY): Payer: Self-pay | Admitting: *Deleted

## 2017-02-27 NOTE — Telephone Encounter (Signed)
Does she mean the Ingrezza? Please call her

## 2017-02-27 NOTE — Telephone Encounter (Signed)
Called pt due to previous call and per provider to get more information. Called number on file 3 times and message stated all lines are busy and to try call another time.

## 2017-02-27 NOTE — Telephone Encounter (Signed)
voice message from patient, said she can't take the medicine that was given to her, it makes her have a headache.   She would like to talk with someone about this.

## 2017-02-28 NOTE — Telephone Encounter (Signed)
noted 

## 2017-02-28 NOTE — Telephone Encounter (Signed)
ok 

## 2017-02-28 NOTE — Telephone Encounter (Signed)
Spoke with pt and she stated she can not take the Ingrezza. Per pt, the reason for this is because it is making her very jerk a lot and shaky. Per pt, she's having more headaches then normal. Per pt, she only took medication for 2 days and it's not working. Per pt, she did not take it last night and she just don't want to take it with all of those symptoms.

## 2017-04-04 ENCOUNTER — Encounter (HOSPITAL_COMMUNITY): Payer: Self-pay | Admitting: Psychiatry

## 2017-04-04 ENCOUNTER — Ambulatory Visit (INDEPENDENT_AMBULATORY_CARE_PROVIDER_SITE_OTHER): Payer: PPO | Admitting: Psychiatry

## 2017-04-04 VITALS — BP 130/78 | HR 85 | Ht 67.0 in | Wt 222.8 lb

## 2017-04-04 DIAGNOSIS — Z818 Family history of other mental and behavioral disorders: Secondary | ICD-10-CM | POA: Diagnosis not present

## 2017-04-04 DIAGNOSIS — F203 Undifferentiated schizophrenia: Secondary | ICD-10-CM

## 2017-04-04 DIAGNOSIS — F202 Catatonic schizophrenia: Secondary | ICD-10-CM | POA: Diagnosis not present

## 2017-04-04 MED ORDER — PERPHENAZINE 8 MG PO TABS: 16.0000 mg | ORAL_TABLET | Freq: Every day | ORAL | 3 refills | Status: DC
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
## 2017-04-24 DIAGNOSIS — K219 Gastro-esophageal reflux disease without esophagitis: Secondary | ICD-10-CM | POA: Diagnosis not present

## 2017-04-24 DIAGNOSIS — I10 Essential (primary) hypertension: Secondary | ICD-10-CM | POA: Diagnosis not present

## 2017-04-24 DIAGNOSIS — G252 Other specified forms of tremor: Secondary | ICD-10-CM | POA: Diagnosis not present

## 2017-04-24 DIAGNOSIS — E039 Hypothyroidism, unspecified: Secondary | ICD-10-CM | POA: Diagnosis not present

## 2017-05-09 ENCOUNTER — Ambulatory Visit: Payer: Self-pay | Admitting: Orthopedic Surgery

## 2017-05-15 ENCOUNTER — Ambulatory Visit (INDEPENDENT_AMBULATORY_CARE_PROVIDER_SITE_OTHER): Payer: PPO | Admitting: Orthopedic Surgery

## 2017-05-15 ENCOUNTER — Encounter: Payer: Self-pay | Admitting: Orthopedic Surgery

## 2017-05-15 DIAGNOSIS — G8929 Other chronic pain: Secondary | ICD-10-CM

## 2017-05-15 DIAGNOSIS — M25552 Pain in left hip: Secondary | ICD-10-CM

## 2017-05-15 DIAGNOSIS — G894 Chronic pain syndrome: Secondary | ICD-10-CM

## 2017-05-15 NOTE — Progress Notes (Signed)
Routine follow-up visit  Chief Complaint  Patient presents with  . Follow-up    LEFT HIP INJECTION    Procedure note injection for LEFT HIP  Verbal consent was obtained for injection of the LEFT HIP  hip   Timeout was completed to confirm the injection site   The medications used were 40 mg of Depo-Medrol and 1% lidocaine 3 cc   Anesthesia was provided by ethyl chloride and the skin was prepped with alcohol.   After cleaning the skin with alcohol a 25-gauge needle was used to inject the  LEFT  BUTTOCK OF THE HIP   RETURN 1-3 MONTHS

## 2017-06-21 DIAGNOSIS — K219 Gastro-esophageal reflux disease without esophagitis: Secondary | ICD-10-CM | POA: Diagnosis not present

## 2017-06-21 DIAGNOSIS — F209 Schizophrenia, unspecified: Secondary | ICD-10-CM | POA: Diagnosis not present

## 2017-06-21 DIAGNOSIS — I10 Essential (primary) hypertension: Secondary | ICD-10-CM | POA: Diagnosis not present

## 2017-06-21 DIAGNOSIS — E039 Hypothyroidism, unspecified: Secondary | ICD-10-CM | POA: Diagnosis not present

## 2017-07-04 ENCOUNTER — Ambulatory Visit (INDEPENDENT_AMBULATORY_CARE_PROVIDER_SITE_OTHER): Payer: PPO | Admitting: Psychiatry

## 2017-07-04 ENCOUNTER — Encounter (HOSPITAL_COMMUNITY): Payer: Self-pay | Admitting: Psychiatry

## 2017-07-04 VITALS — BP 137/78 | HR 80 | Ht 67.0 in | Wt 215.8 lb

## 2017-07-04 DIAGNOSIS — F200 Paranoid schizophrenia: Secondary | ICD-10-CM

## 2017-07-04 DIAGNOSIS — F203 Undifferentiated schizophrenia: Secondary | ICD-10-CM

## 2017-07-04 DIAGNOSIS — Z818 Family history of other mental and behavioral disorders: Secondary | ICD-10-CM | POA: Diagnosis not present

## 2017-07-04 DIAGNOSIS — F202 Catatonic schizophrenia: Secondary | ICD-10-CM

## 2017-07-04 DIAGNOSIS — Z79899 Other long term (current) drug therapy: Secondary | ICD-10-CM

## 2017-07-04 MED ORDER — PERPHENAZINE 8 MG PO TABS: 16.0000 mg | ORAL_TABLET | Freq: Every day | ORAL | 3 refills | Status: DC
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
## 2017-07-09 ENCOUNTER — Encounter (HOSPITAL_COMMUNITY): Payer: Self-pay | Admitting: *Deleted

## 2017-07-09 ENCOUNTER — Emergency Department (HOSPITAL_COMMUNITY): Payer: PPO

## 2017-07-09 ENCOUNTER — Emergency Department (HOSPITAL_COMMUNITY)
Admission: EM | Admit: 2017-07-09 | Discharge: 2017-07-10 | Disposition: A | Discharge status: Home / Self Care | Payer: PPO | Attending: Emergency Medicine | Admitting: Emergency Medicine

## 2017-07-09 DIAGNOSIS — S60221A Contusion of right hand, initial encounter: Secondary | ICD-10-CM | POA: Diagnosis not present

## 2017-07-09 DIAGNOSIS — M25531 Pain in right wrist: Secondary | ICD-10-CM | POA: Diagnosis not present

## 2017-07-09 DIAGNOSIS — Y939 Activity, unspecified: Secondary | ICD-10-CM | POA: Insufficient documentation

## 2017-07-09 DIAGNOSIS — Y999 Unspecified external cause status: Secondary | ICD-10-CM | POA: Diagnosis not present

## 2017-07-09 DIAGNOSIS — Y92019 Unspecified place in single-family (private) house as the place of occurrence of the external cause: Secondary | ICD-10-CM | POA: Diagnosis not present

## 2017-07-09 DIAGNOSIS — S8001XA Contusion of right knee, initial encounter: Secondary | ICD-10-CM | POA: Diagnosis not present

## 2017-07-09 DIAGNOSIS — W01198A Fall on same level from slipping, tripping and stumbling with subsequent striking against other object, initial encounter: Secondary | ICD-10-CM | POA: Insufficient documentation

## 2017-07-09 DIAGNOSIS — S0083XA Contusion of other part of head, initial encounter: Secondary | ICD-10-CM | POA: Diagnosis not present

## 2017-07-09 DIAGNOSIS — S0990XA Unspecified injury of head, initial encounter: Secondary | ICD-10-CM | POA: Diagnosis not present

## 2017-07-09 DIAGNOSIS — W19XXXA Unspecified fall, initial encounter: Secondary | ICD-10-CM

## 2017-07-09 DIAGNOSIS — Z79899 Other long term (current) drug therapy: Secondary | ICD-10-CM | POA: Diagnosis not present

## 2017-07-09 DIAGNOSIS — M25561 Pain in right knee: Secondary | ICD-10-CM | POA: Diagnosis not present

## 2017-07-09 DIAGNOSIS — Z87891 Personal history of nicotine dependence: Secondary | ICD-10-CM | POA: Insufficient documentation

## 2017-07-09 DIAGNOSIS — S60211A Contusion of right wrist, initial encounter: Secondary | ICD-10-CM

## 2017-07-09 DIAGNOSIS — S6991XA Unspecified injury of right wrist, hand and finger(s), initial encounter: Secondary | ICD-10-CM | POA: Diagnosis not present

## 2017-07-09 DIAGNOSIS — S199XXA Unspecified injury of neck, initial encounter: Secondary | ICD-10-CM | POA: Diagnosis not present

## 2017-07-09 DIAGNOSIS — S098XXA Other specified injuries of head, initial encounter: Secondary | ICD-10-CM | POA: Diagnosis not present

## 2017-07-09 DIAGNOSIS — S0993XA Unspecified injury of face, initial encounter: Secondary | ICD-10-CM | POA: Diagnosis not present

## 2017-07-09 DIAGNOSIS — Y92009 Unspecified place in unspecified non-institutional (private) residence as the place of occurrence of the external cause: Secondary | ICD-10-CM

## 2017-07-09 DIAGNOSIS — S064X0A Epidural hemorrhage without loss of consciousness, initial encounter: Secondary | ICD-10-CM | POA: Diagnosis not present

## 2017-07-09 MED ORDER — ACETAMINOPHEN 325 MG PO TABS
650.0000 mg | ORAL_TABLET | Freq: Once | ORAL | Status: DC
  Filled 2017-07-09: qty 2

## 2017-07-09 NOTE — ED Triage Notes (Signed)
Pt brought in by [facility] for c/o fall; pt states she tripped over some belongings in her home and her head hit the floor; pt has large hematoma to right side of head; pt has pain to right side of her face along her jaw; pt refused c-collar by ems

## 2017-07-09 NOTE — ED Notes (Signed)
Pt ambulatory in restroom without difficulty.

## 2017-07-10 DIAGNOSIS — Z743 Need for continuous supervision: Secondary | ICD-10-CM | POA: Diagnosis not present

## 2017-07-10 DIAGNOSIS — M25561 Pain in right knee: Secondary | ICD-10-CM | POA: Diagnosis not present

## 2017-07-10 DIAGNOSIS — R279 Unspecified lack of coordination: Secondary | ICD-10-CM | POA: Diagnosis not present

## 2017-07-10 DIAGNOSIS — S6991XA Unspecified injury of right wrist, hand and finger(s), initial encounter: Secondary | ICD-10-CM | POA: Diagnosis not present

## 2017-07-10 MED ORDER — TRAMADOL HCL 50 MG PO TABS
50.0000 mg | ORAL_TABLET | Freq: Once | ORAL | Status: AC
  Administered 2017-07-10: 50 mg via ORAL
  Filled 2017-07-10: qty 1

## 2017-07-10 NOTE — ED Provider Notes (Signed)
[facility] DEPT Provider Note   CSN: [id number] Arrival date & time: 07/09/17  2233  Time seen 23:10 PM   History   Chief Complaint Chief Complaint  Patient presents with  . Fall    HPI [name] is a 76 y.o. female.  HPI  patient reports about 8:30 PM she had a cat in her house that she didn't want in her house. She states it was visiting her kittens. She was trying to get it out and she tripped and fell. She states she hit her head on the floor. She states her teeth on the right hurt and " my glands hurt" and she holds her hand up to the right side of her neck anteriorly. She also complains of pain in her right hand and wrist and her right knee. 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 She denies loss of consciousness. She reports a lot of swelling where she hit her head. She states she lives with her 34 year old sister. She denies being on blood thinners. She was going to wait to come be evaluated until the morning when her sister could drive her to the ED however she called a nurse line and they instructed her to come to the ED tonight.  PCP Wilson Singer, MD   Past Medical History:  Diagnosis Date  . AC (acromioclavicular) joint bone spurs    on hip joints   . Hiatal hernia   . High cholesterol   . Osteoarthritis   . Schizophrenia (HCC)   . Seizures (HCC)   . Thyroid disorder     Patient Active Problem List   Diagnosis Date Noted  . 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 Schizophrenia (HCC) 10/02/2012  . Bursitis, hip 05/03/2011  . SCIATICA 09/29/2010  . HAND, ARTHRITIS, DEGEN./OSTEO 05/25/2009  . HIP PAIN 11/19/2007    Past Surgical History:  Procedure Laterality Date  . back for spinal stenonsis not by Dr. Romeo Apple    . CHOLECYSTECTOMY    . rt knee replacement not by Dr. Romeo Apple      OB History    No data available       Home Medications    Prior to Admission medications   Medication Sig Start Date End Date  Taking? Authorizing Provider  Cimetidine (TAGAMET PO) Take 300 mg by mouth at bedtime.     [provider]  levothyroxine (SYNTHROID, LEVOTHROID) 25 MCG tablet Take 25 mcg by mouth daily before breakfast.    [provider]  Multiple Vitamins-Minerals (ONE-A-DAY WOMENS 50 PLUS PO) Take 1 tablet by mouth daily.    [provider]  NABUMETONE PO Take 750 mg by mouth daily. Reported on 12/15/2015    [provider]  perphenazine (TRILAFON) 8 MG tablet Take 2 tablets (16 mg total) by mouth at bedtime. 07/04/17   Myrlene Broker, MD  PREDNISONE PO Take by mouth daily.    [provider]  traMADol (ULTRAM) 50 MG tablet Take 50 mg by mouth every 6 (six) hours as needed.    [provider]    Family History Family History  Problem Relation Age of Onset  . Depression Other   . Schizophrenia Neg Hx   . ADD / ADHD Neg Hx   . Alcohol abuse Neg Hx   . Drug abuse Neg Hx   . Anxiety disorder Neg Hx   . Bipolar disorder Neg Hx   . Dementia Neg Hx   . OCD Neg  Hx   . Paranoid behavior Neg Hx   . Seizures Neg Hx   . Sexual abuse Neg Hx   . Physical abuse Neg Hx     Social History Social History  Substance Use Topics  . Smoking status: Former Games developer  . Smokeless tobacco: Never Used  . Alcohol use No  lives at home Lives with 73 yo sister widowed   Allergies   Thorazine [chlorpromazine]   Review of Systems Review of Systems  All other systems reviewed and are negative.    Physical Exam Updated Vital Signs BP (!) 172/108 (BP Location: Left Arm) Comment: Pt reports hx of hypertension. Noncompliant with medications.   Pulse 76   Temp 97.9 F (36.6 C) (Oral)   Resp 20   SpO2 100%   Vital signs normal except for hypertension   Physical Exam  Constitutional: She is oriented to person, place, and time. She appears well-developed and well-nourished.  Non-toxic appearance. She does not appear ill. No distress.  HENT:  Head:  Normocephalic.  Right Ear: External ear normal.  Left Ear: External ear normal.  Nose: Nose normal. No mucosal edema or rhinorrhea.  Mouth/Throat: Oropharynx is clear and moist and mucous membranes are normal. No dental abscesses or uvula swelling.  Patient has a large hematoma and swelling of the right side of her forehead. She has some discomfort on opening her mouth. There is no obvious trauma to her face.  Eyes: Pupils are equal, round, and reactive to light. Conjunctivae and EOM are normal.  Neck: Normal range of motion and full passive range of motion without pain. Neck supple.  She complains of tenderness underneath her right mandible and in her posterior cervical chain on the right. However I do not feel any swelling or masses. She keeps talking about her thyroid. She was informed that is not where her thyroid is located.  Cardiovascular: Normal rate, regular rhythm and normal heart sounds.  Exam reveals no gallop and no friction rub.   No murmur heard. Pulmonary/Chest: Effort normal and breath sounds normal. No respiratory distress. She has no wheezes. She has no rhonchi. She has no rales. She exhibits no tenderness and no crepitus.  Abdominal: Soft. Normal appearance and bowel sounds are normal. She exhibits no distension. There is no tenderness. There is no rebound and no guarding.  Musculoskeletal: Normal range of motion. She exhibits tenderness. She exhibits no edema.  Moves all extremities well. Patient's noted to have some bruising along the ulnar aspect of her right wrist. She has some minor discomfort on extreme dorsiflexion and supination. She complains of pain along the ulnar aspect of the dorsum of her hand without bruising or swelling or crepitance felt. There is a small bruise just superior to the right knee just to the right of midline, laterally. When I palpate her knee joint there is obvious crepitance in the knee joint on the right that I have not felt before.  Neurological:  She is alert and oriented to person, place, and time. She has normal strength. No cranial nerve deficit.  Marked intentional tremor  Skin: Skin is warm, dry and intact. No rash noted. No erythema. No pallor.  Psychiatric: She has a normal mood and affect. Her speech is normal and behavior is normal. Her mood appears not anxious.  Nursing note and vitals reviewed.      ED Treatments / Results  Labs (all labs ordered are listed, but only abnormal results are displayed) Labs Reviewed - No data to  display  EKG  EKG Interpretation None       Radiology Dg Wrist Complete Right  Result Date: 07/09/2017 CLINICAL DATA:  Trip and fall injury.  Right wrist pain. EXAM: RIGHT WRIST - COMPLETE 3+ VIEW COMPARISON:  None. FINDINGS: Diffuse bone demineralization. Prominent degenerative changes in the STT and first carpometacarpal joints. Slight calcification in the triangular fibrocartilage. No evidence of acute fracture or dislocation. No destructive bone lesions. Soft tissues are unremarkable. IMPRESSION: Degenerative changes in the right wrist with calcification in the triangular fibrocartilage. No acute bony abnormalities. Electronically Signed   By: Burman NievesWilliam  Stevens M.D.   On: 07/09/2017 23:21   Ct Head Wo Contrast CT Maxillofacial Wo Contrast  Ct Cervical Spine Wo Contrast  Result Date: 07/09/2017 CLINICAL DATA:  Status post trip and fall today with a blow to the head. Large hematoma on the right side of the head. EXAM: CT HEAD WITHOUT CONTRAST CT MAXILLOFACIAL WITHOUT CONTRAST CT CERVICAL SPINE WITHOUT CONTRAST TECHNIQUE: Multidetector CT imaging of the head, cervical spine, and maxillofacial structures were performed using the standard protocol without intravenous contrast. Multiplanar CT image reconstructions of the cervical spine and maxillofacial structures were also generated. COMPARISON:  None. FINDINGS: CT HEAD FINDINGS Brain: There is cortical atrophy and some chronic microvascular ischemic  change. No evidence of acute intracranial abnormality including hemorrhage, infarct, mass lesion, mass effect, midline shift or abnormal extra-axial fluid collection. No hydrocephalus or pneumocephalus. Vascular: Atherosclerosis noted. Skull: Intact. Other: Large scalp hematoma on the right is identified. CT MAXILLOFACIAL FINDINGS Osseous: No fracture or mandibular dislocation. No destructive process. Orbits: Negative. No traumatic or inflammatory finding. Sinuses: Mild mucosal thickening is seen in the left sphenoid sinus. Soft tissues: Negative. CT CERVICAL SPINE FINDINGS Alignment: Reversal of the normal cervical lordosis is noted. Trace anterolisthesis C3 on C4 due to facet degenerative change is noted. Skull base and vertebrae: No acute fracture. No primary bone lesion or focal pathologic process. Soft tissues and spinal canal: No prevertebral fluid or swelling. No visible canal hematoma. Disc levels: Severe loss of disc space height is present from C4-T1. Multilevel facet arthropathy is identified. Upper chest: Clear. Other: None. IMPRESSION: Large hematoma on the right side of the head. No underlying fracture. No acute intracranial abnormality or acute abnormality of the cervical spine or face. Mild atrophy and chronic microvascular ischemic change. Atherosclerosis. Multilevel cervical degenerative disease. Electronically Signed   By: Drusilla Kannerhomas  Dalessio M.D.   On: 07/09/2017 23:33   Dg Knee Complete 4 Views Right  Result Date: 07/10/2017 CLINICAL DATA:  Right knee pain due to a fall today. Initial encounter. EXAM: RIGHT KNEE - COMPLETE 4+ VIEW COMPARISON:  Plain films right knee 06/02/2005. FINDINGS: There is no acute bony or joint abnormality. Right knee arthroplasty is in place. No hardware complication. No joint effusion. IMPRESSION: No acute abnormality. Electronically Signed   By: Drusilla Kannerhomas  Dalessio M.D.   On: 07/10/2017 00:06   Dg Hand Complete Right  Result Date: 07/10/2017 CLINICAL DATA:  Trip and  fall. EXAM: RIGHT HAND - COMPLETE 3+ VIEW COMPARISON:  None available for comparison at time of study interpretation. FINDINGS: No acute fracture deformity or dislocation. Severe first carpometacarpal and lateral carpal carpal joint space narrowing, periarticular sclerosis and marginal spurring. Osteopenia. Soft tissue planes are nonsuspicious. IMPRESSION: No acute fracture deformity or dislocation. Severe degenerative change of the lateral wrist. Electronically Signed   By: Awilda Metroourtnay  Bloomer M.D.   On: 07/10/2017 00:07      Procedures Procedures (including critical care time)  Medications Ordered in ED Medications  acetaminophen (TYLENOL) tablet 650 mg (650 mg Oral Refused 07/09/17 2325)  traMADol (ULTRAM) tablet 50 mg (50 mg Oral Given 07/10/17 0018)     Initial Impression / Assessment and Plan / ED Course  I have reviewed the triage vital signs and the nursing notes.  Pertinent labs & imaging results that were available during my care of the patient were reviewed by me and considered in my medical decision making (see chart for details).      X-rays or CT scans were ordered of all the painful or injured areas. Patient refused Tylenol for pain. After reviewing the [facility] database she was given tramadol.  Patient was informed of her x-ray results. We discussed that she will end up with a bruising around her eye due to gravity in the large amount of swelling in her forehead. She does live with somebody who is able to call EMS if she has any problems listed on the head injury sheet. She was discharged home.  Patient states she is on medication for her tremor and she's being referred to a specialist soon.   Review of the [place] shows patient gets #60 tramadol monthly from her PCP, #60 tramadol 50 mg tablets last filled August 7  Final Clinical Impressions(s) / ED Diagnoses   Final diagnoses:  Fall in home, initial encounter  Contusion of other part of head,  initial encounter  Contusion of right knee, initial encounter  Contusion of right hand, initial encounter  Contusion of right wrist, initial encounter    New Prescriptions Pt has tramadol  Plan discharge   Devoria Albe, MD, Concha Pyo, MD 07/10/17 [phone]

## 2017-07-10 NOTE — Discharge Instructions (Signed)
Use ice packs for comfort and swelling. You can take your tramadol for pain. Return to the ED for any problems listed on the head injury sheet. You will probably have bruising appear around your right eye over the next couple of days from the blood from the swelling on your forehead drifting down into your face.

## 2017-07-12 DIAGNOSIS — S0003XA Contusion of scalp, initial encounter: Secondary | ICD-10-CM | POA: Diagnosis not present

## 2017-07-21 ENCOUNTER — Ambulatory Visit (INDEPENDENT_AMBULATORY_CARE_PROVIDER_SITE_OTHER): Payer: PPO | Admitting: Orthopedic Surgery

## 2017-07-21 ENCOUNTER — Encounter: Payer: Self-pay | Admitting: Orthopedic Surgery

## 2017-07-21 VITALS — BP 124/77 | HR 79 | Ht 67.0 in | Wt 212.0 lb

## 2017-07-21 DIAGNOSIS — S8001XA Contusion of right knee, initial encounter: Secondary | ICD-10-CM

## 2017-07-21 DIAGNOSIS — S40021A Contusion of right upper arm, initial encounter: Secondary | ICD-10-CM

## 2017-07-21 DIAGNOSIS — S0003XA Contusion of scalp, initial encounter: Secondary | ICD-10-CM

## 2017-07-21 DIAGNOSIS — S8011XA Contusion of right lower leg, initial encounter: Secondary | ICD-10-CM

## 2017-07-21 NOTE — Progress Notes (Signed)
Patient ID: Sherry Navarro, female   DOB: 28-May-1941, 76 y.o.   MRN: [id number]  Chief Complaint  Patient presents with  . Follow-up    Right knee pain, follow up after ER, DOI 07-09-17.    HPI Sherry Navarro is a 76 y.o. female.  Presents with new problem. She fell trying to get the cats out of the house and sustained multiple injuries. She went to the ER on the ninth presents now 12 days later complaining of swelling over the right forehead pain in the right and swelling right arm numbness and tingling at night right hand swelling right knee status post right total knee swelling right knee swelling right leg and ankle  The pain is mild to moderate it's dull constant aching and as far as the right hand goes the thumb index and long finger go numb at night  Review of Systems Review of Systems  Constitutional: Negative for fever.  Eyes: Negative for visual disturbance.   (2 MINIMUM)  Past Medical History:  Diagnosis Date  . AC (acromioclavicular) joint bone spurs    on hip joints   . Hiatal hernia   . High cholesterol   . Osteoarthritis   . Schizophrenia (HCC)   . Seizures (HCC)   . Thyroid disorder     Past Surgical History:  Procedure Laterality Date  . back for spinal stenonsis not by Dr. Romeo Apple    . CHOLECYSTECTOMY    . rt knee replacement not by Dr. Romeo Apple      Social History Social History  Substance Use Topics  . Smoking status: Former Games developer  . Smokeless tobacco: Never Used  . Alcohol use No    Allergies  Allergen Reactions  . Thorazine [Chlorpromazine] Other (See Comments)    seizures    No outpatient prescriptions have been marked as taking for the 07/21/17 encounter (Office Visit) with Vickki Hearing, MD.      Physical Exam Physical Exam 1.BP 124/77   Pulse 79   Ht  (1.702 m)   Wt 212 lb (96.2 kg)   BMI 33.20 kg/m   2. Gen. appearance. The patient is well-developed and well-nourished, grooming and hygiene are normal. There are no  gross congenital abnormalities  3. The patient is alert and oriented to person place and time  4. Mood and affect are normal  5. Ambulation Is supported by a cane she does have a slight limp favoring the right leg  Examination reveals the following: 6. On inspection we find right knee swelling ecchymosis subcutaneous hematoma  7. With the range of motion of  10-110  8. Stability tests were normal  right shoulder, right knee, right ankle  9. Strength tests revealed grade 5 motor strength right knee  10. Skin over the head area she has a small hematoma skin ecchymosis, right arm ecchymosis right knee ecchymosis right leg ecchymosis including right hand right arm right leg  11. Sensation remains intact  12 Vascular system shows mild peripheral edema right lower extremity  Right upper extremity: She has a negative Tinel's over the right hand. She has full flexion extension of the hand and wrist.   MEDICAL DECISION MAKING:    Data Reviewed X-rays right hand showed arthritis of the base of the thumb and some of the PIP joints  Her right total knee is evaluated with x-ray and is stable  The reports EXAM: RIGHT KNEE - COMPLETE 4+ VIEW   COMPARISON:  Plain films right knee 06/02/2005.  FINDINGS: There is no acute bony or joint abnormality. Right knee arthroplasty is in place. No hardware complication. No joint effusion.   IMPRESSION: No acute abnormality.     Electronically Signed   By: Drusilla Kanner M.D.   On: 07/10/2017 00:06   RIGHT HAND - COMPLETE 3+ VIEW   COMPARISON:  None available for comparison at time of study interpretation.   FINDINGS: No acute fracture deformity or dislocation. Severe first carpometacarpal and lateral carpal carpal joint space narrowing, periarticular sclerosis and marginal spurring. Osteopenia. Soft tissue planes are nonsuspicious.   IMPRESSION: No acute fracture deformity or dislocation.   Severe degenerative change of the  lateral wrist.     Electronically Signed   By: Awilda Metro M.D.   Assessment Encounter Diagnosis  Name Primary?  . Contusion of right knee, initial encounter Yes    Plan Recommend heat 4 times a day for 20 minutes    Fuller Canada 07/21/2017, 11:19 AM

## 2017-07-21 NOTE — Patient Instructions (Signed)
Heat for 20 min 4 x a day

## 2017-08-14 ENCOUNTER — Ambulatory Visit: Payer: Self-pay | Admitting: Orthopedic Surgery

## 2017-08-21 ENCOUNTER — Ambulatory Visit: Payer: Self-pay | Admitting: Orthopedic Surgery

## 2017-08-30 DIAGNOSIS — E039 Hypothyroidism, unspecified: Secondary | ICD-10-CM | POA: Diagnosis not present

## 2017-08-30 DIAGNOSIS — I10 Essential (primary) hypertension: Secondary | ICD-10-CM | POA: Diagnosis not present

## 2017-08-30 DIAGNOSIS — K219 Gastro-esophageal reflux disease without esophagitis: Secondary | ICD-10-CM | POA: Diagnosis not present

## 2017-08-30 DIAGNOSIS — E1310 Other specified diabetes mellitus with ketoacidosis without coma: Secondary | ICD-10-CM | POA: Diagnosis not present

## 2017-08-30 DIAGNOSIS — R5383 Other fatigue: Secondary | ICD-10-CM | POA: Diagnosis not present

## 2017-08-30 DIAGNOSIS — E559 Vitamin D deficiency, unspecified: Secondary | ICD-10-CM | POA: Diagnosis not present

## 2017-09-15 ENCOUNTER — Telehealth: Payer: Self-pay | Admitting: Orthopedic Surgery

## 2017-09-15 NOTE — Telephone Encounter (Signed)
NABUMETONE    Patient is asking if you would do a prescription for the above medication. She states that her PCP (Dr. Karilyn CotaGosrani) wants to get away from prescribing pain medication.  Please advise.

## 2017-09-15 NOTE — Telephone Encounter (Signed)
No  So do I

## 2017-09-18 NOTE — Telephone Encounter (Signed)
Patient is aware of Dr. Mort SawyersHarrison's reply.

## 2017-09-22 IMAGING — DX DG KNEE COMPLETE 4+V*L*
4 series · 4 of 4 positions shown · non-contrast
Comparison: None.

CLINICAL DATA: Exacerbation of chronic left knee pain which is been
worsening over the last 2 days.

EXAM:
LEFT KNEE - COMPLETE 4+ VIEW

[knee ap]
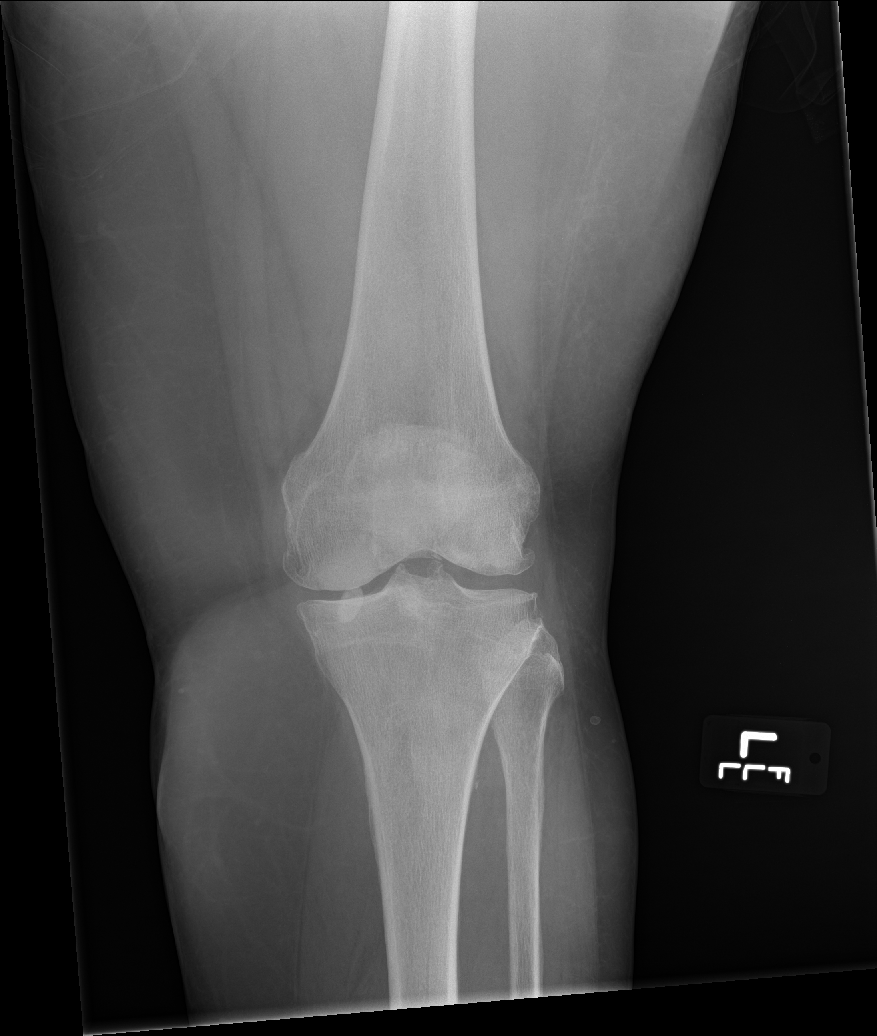

[knee obl (1 of 2)]
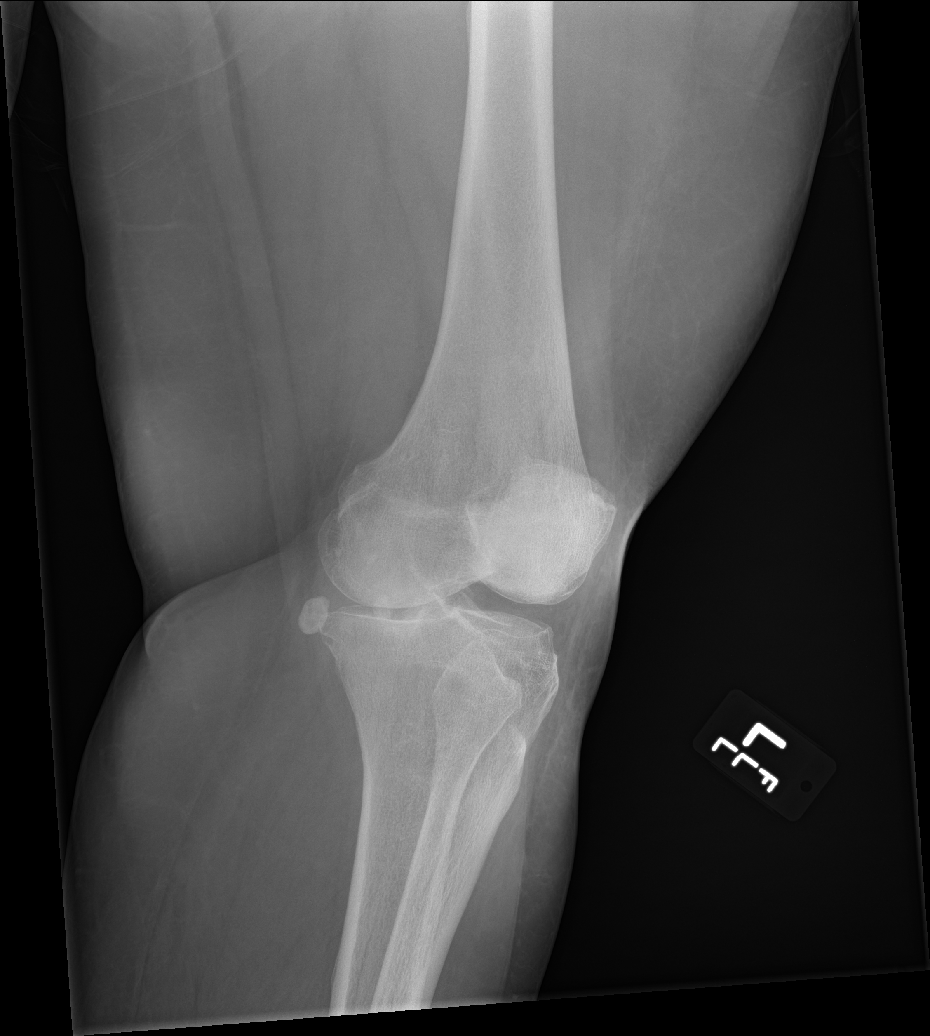

[knee obl (2 of 2)]
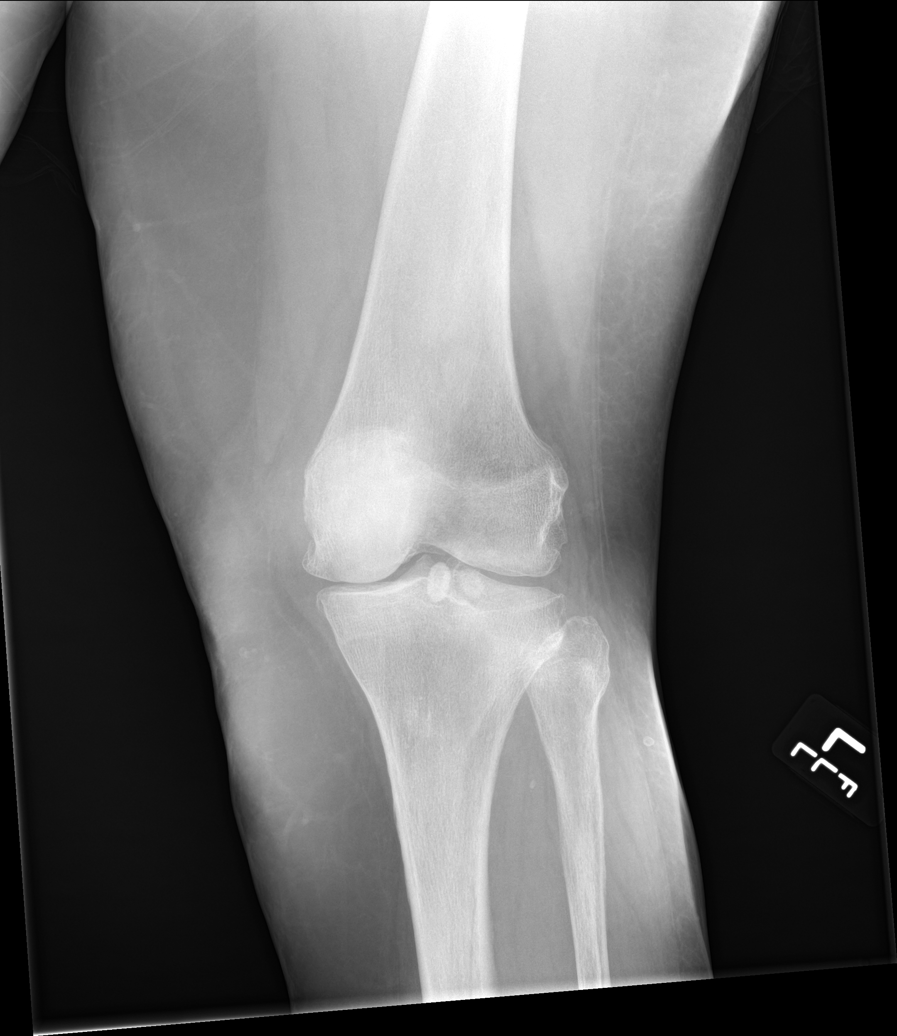

[knee lat]
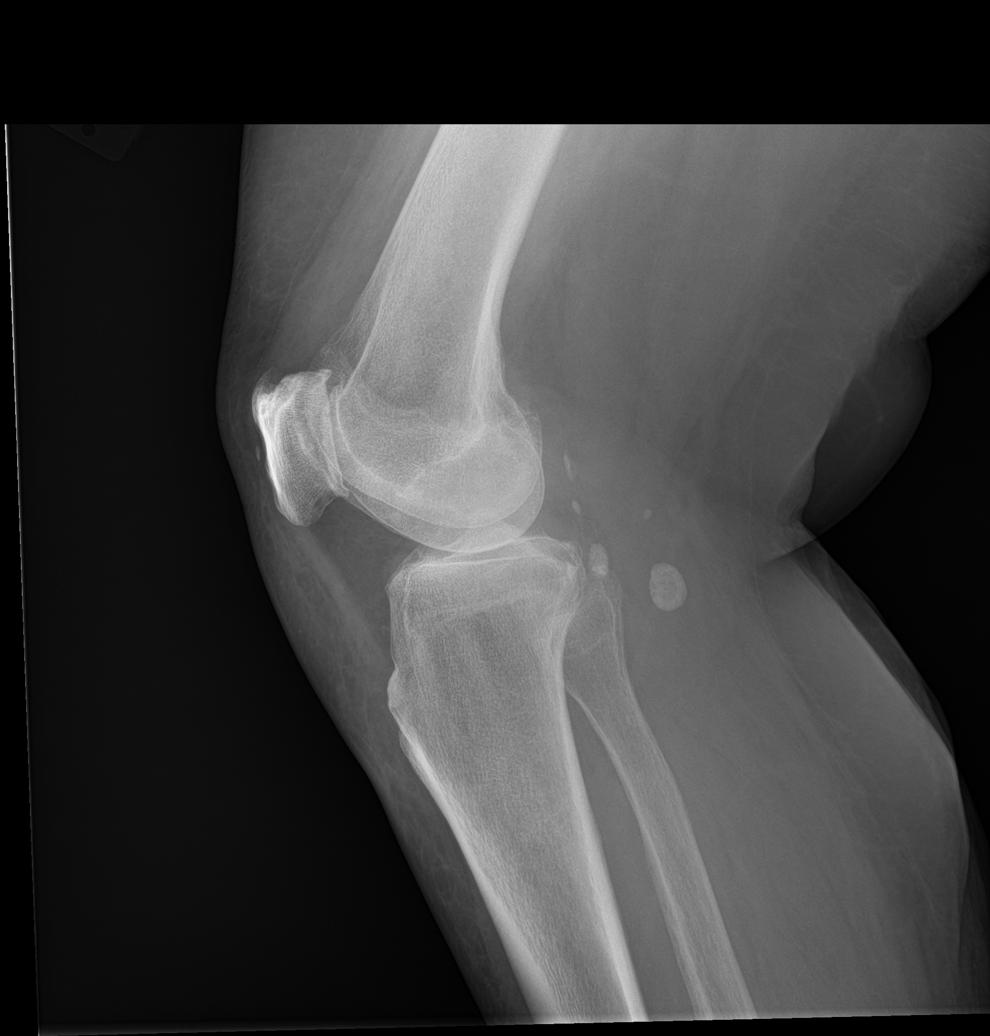

[4 of 4 positions shown; findings below may reference images not displayed]

FINDINGS: No evidence of fracture. Loss of joint space is noted in the medial
compartment. Hypertrophic spurring is visible in all 3 compartments.
No evidence for fluid in the suprapatellar bursa. Several
mineralized fragments are seen posterior to the knee, likely
representing intra-articular loose bodies, some of which may be
contained within a Baker cyst.
IMPRESSION: No acute bony abnormality.

Tricompartmental degenerative changes.

Probable intra-articular loose bodies.

## 2017-09-27 DIAGNOSIS — M159 Polyosteoarthritis, unspecified: Secondary | ICD-10-CM | POA: Diagnosis not present

## 2017-10-02 ENCOUNTER — Ambulatory Visit: Payer: Self-pay | Admitting: Orthopedic Surgery

## 2017-10-03 ENCOUNTER — Other Ambulatory Visit (HOSPITAL_COMMUNITY): Payer: Self-pay | Admitting: Psychiatry

## 2017-10-03 ENCOUNTER — Ambulatory Visit (HOSPITAL_COMMUNITY): Payer: PPO | Admitting: Psychiatry

## 2017-10-03 ENCOUNTER — Telehealth (HOSPITAL_COMMUNITY): Payer: Self-pay | Admitting: *Deleted

## 2017-10-03 DIAGNOSIS — F202 Catatonic schizophrenia: Secondary | ICD-10-CM

## 2017-10-03 MED ORDER — PERPHENAZINE 8 MG PO TABS: 16.0000 mg | ORAL_TABLET | Freq: Every day | ORAL | 3 refills | Status: DC

## 2017-10-03 NOTE — Telephone Encounter (Signed)
sent 

## 2017-10-03 NOTE — Telephone Encounter (Signed)
Patient notified that Rx refill has been sent

## 2017-10-03 NOTE — Telephone Encounter (Signed)
Patient called in to reschedule appointment due to plumbing issues in her home.  She's requesting refill on  Perphenazine appointment is scheduled for next week.

## 2017-10-09 ENCOUNTER — Ambulatory Visit (HOSPITAL_COMMUNITY): Payer: Self-pay | Admitting: Psychiatry

## 2017-10-16 ENCOUNTER — Ambulatory Visit (INDEPENDENT_AMBULATORY_CARE_PROVIDER_SITE_OTHER): Payer: PPO | Admitting: Orthopedic Surgery

## 2017-10-16 ENCOUNTER — Encounter: Payer: Self-pay | Admitting: Orthopedic Surgery

## 2017-10-16 VITALS — BP 158/78 | HR 78 | Ht 67.0 in | Wt 210.0 lb

## 2017-10-16 DIAGNOSIS — M25552 Pain in left hip: Secondary | ICD-10-CM | POA: Diagnosis not present

## 2017-10-16 DIAGNOSIS — G8929 Other chronic pain: Secondary | ICD-10-CM

## 2017-10-16 NOTE — Progress Notes (Signed)
Patient requests injection left hip for chronic hip pain and lower back pain  IM injection given left hip  A steroid injection was performed left gluteal area using Depo-Medrol 40 lidocaine 1% 1-1 mixture.  We injected after obtaining verbal consent and appropriate timeout  Encounter Diagnosis  Name Primary?  . Chronic left hip pain Yes   Fu at her request

## 2017-10-18 ENCOUNTER — Encounter (HOSPITAL_COMMUNITY): Payer: Self-pay | Admitting: Psychiatry

## 2017-10-18 ENCOUNTER — Ambulatory Visit (HOSPITAL_COMMUNITY): Payer: PPO | Admitting: Psychiatry

## 2017-10-18 VITALS — BP 144/81 | HR 77 | Ht 67.0 in | Wt 209.0 lb

## 2017-10-18 DIAGNOSIS — F202 Catatonic schizophrenia: Secondary | ICD-10-CM

## 2017-10-18 DIAGNOSIS — Z818 Family history of other mental and behavioral disorders: Secondary | ICD-10-CM

## 2017-10-18 DIAGNOSIS — Z87891 Personal history of nicotine dependence: Secondary | ICD-10-CM

## 2017-10-18 DIAGNOSIS — Z79899 Other long term (current) drug therapy: Secondary | ICD-10-CM

## 2017-10-18 MED ORDER — PERPHENAZINE 8 MG PO TABS: 16.0000 mg | ORAL_TABLET | Freq: Every day | ORAL | 3 refills | Status: DC

## 2017-10-18 MED ORDER — PROPRANOLOL HCL 10 MG PO TABS: 10.0000 mg | ORAL_TABLET | Freq: Three times a day (TID) | ORAL | 2 refills | Status: DC

## 2017-10-18 NOTE — Progress Notes (Signed)
BH MD/PA/NP OP Progress Note  10/18/2017 1:58 PM Sherry Navarro  MRN:  [id number]  Chief Complaint:  Chief Complaint    Schizophrenia; Follow-up     HPI: This patient is a 76 year old widowed white female who lives with her sister in [place]. She used to work as a Tree surgeonbeautician  The patient states that she has had mental illness since her early 4820s. She was not married to a man was running around on her and she had a breakdown and was diagnosed with schizophrenia. We don't have any documentation of this. She was on Navane for years but it was no longer being manufactured to several months ago she was put on perphenazine 16 mg per day. She has terrible shaking in her hands and some in her feet which is probably secondary to these neuroleptic medications. She claims she had this shaking even before she was on medicine but that it's difficult to believe. She claims that she tries to get off these medicines she gets terrible headaches but I think we need to try given her shakiness. She claims she's tried Inderal Cogentin and benzodiazepines but nothing has helped.  Currently she has no paranoia auditory or visual hallucinations depression or suicidal ideation. She her sister go shopping every day and ought to be and she seems happy with her current lifestyle  Patient returns after 3 months.  Her hand tremors are terrible and she can stop shaking.  I told her today that we at least need to retry propranolol and she is reluctant but claims she may agree to try it "after Christmas."  She does not want to get off the perphenazine either.  She states that she now has a cat and she enjoys it.  She denies auditory visual hallucinations or paranoia.  She states that she worries about "the devil causing all these bad things in the world" but she did not elaborate.  She is sleeping fairly well unless the cat wakes her up. Visit Diagnosis:    ICD-10-CM   1. 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 AC (acromioclavicular) joint bone spurs    on hip joints   . Hiatal hernia   . High cholesterol   . Osteoarthritis   . Schizophrenia (HCC)   . Seizures (HCC)   . Thyroid disorder     Past Surgical History:  Procedure Laterality Date  . back for spinal stenonsis not by Dr. [name]    . CHOLECYSTECTOMY    . rt knee replacement not by Dr. [name]      Family Psychiatric History: See below  Family History:  Family History  Problem Relation Age of Onset  . Depression Other   . Schizophrenia Neg Hx   . ADD / ADHD Neg Hx   . Alcohol abuse Neg Hx   . Drug abuse Neg Hx   . Anxiety disorder Neg Hx   . Bipolar disorder Neg Hx   . Dementia Neg Hx   . OCD Neg Hx   . Paranoid behavior Neg Hx   . Seizures Neg Hx   . Sexual abuse Neg Hx   . Physical abuse Neg Hx     Social History:  Social History   Socioeconomic History  . Marital status: Widowed    Spouse name: None  .  Number of children: None  . Years of education: None  . Highest education level: None  Social Needs  . Financial resource strain: None  . Food insecurity - worry: None  . Food insecurity - inability: None  . Transportation needs - medical: None  . Transportation needs - non-medical: None  Occupational History  . None  Tobacco Use  . Smoking status: Former Games developermoker  . Smokeless tobacco: Never Used  Substance and Sexual Activity  . Alcohol use: No  . Drug use: No  . Sexual activity: No  Other Topics Concern  . None  Social History Narrative  . None    Allergies:  Allergies  Allergen Reactions  . Thorazine [Chlorpromazine] Other (See Comments)    seizures    Metabolic Disorder Labs: Lab Results  Component Value Date   HGBA1C 5.5 04/03/2012   MPG 111 04/03/2012   No  results found for: PROLACTIN No results found for: CHOL, TRIG, HDL, CHOLHDL, VLDL, LDLCALC No results found for: TSH  Therapeutic Level Labs: No results found for: LITHIUM No results found for: VALPROATE No components found for:  CBMZ  Current Medications: Current Outpatient Medications  Medication Sig Dispense Refill  . Cimetidine (TAGAMET PO) Take 300 mg by mouth at bedtime.     Marland Kitchen. levothyroxine (SYNTHROID, LEVOTHROID) 25 MCG tablet Take 25 mcg by mouth daily before breakfast.    . Multiple Vitamins-Minerals (ONE-A-DAY WOMENS 50 PLUS PO) Take 1 tablet by mouth daily.    Marland Kitchen. NABUMETONE PO Take 750 mg by mouth daily. Reported on 12/15/2015    . perphenazine (TRILAFON) 8 MG tablet Take 2 tablets (16 mg total) by mouth at bedtime. 60 tablet 3  . PREDNISONE PO Take by mouth daily.    . traMADol (ULTRAM) 50 MG tablet Take 50 mg by mouth every 6 (six) hours as needed.    . propranolol (INDERAL) 10 MG tablet Take 1 tablet (10 mg total) by mouth 3 (three) times daily. 90 tablet 2   No current facility-administered medications for this visit.      Musculoskeletal: Strength & Muscle Tone: decreased Gait & Station: unsteady Patient leans: N/A  Psychiatric Specialty Exam: Review of Systems  Musculoskeletal: Positive for back pain and joint pain.  Neurological: Positive for tremors.  Psychiatric/Behavioral: The patient is nervous/anxious.   All other systems reviewed and are negative.   Blood pressure (!) 144/81, pulse 77, height 5\' 7"  (1.702 m), weight 209 lb (94.8 kg), SpO2 93 %.Body mass index is 32.73 kg/m.  General Appearance: Casual and Fairly Groomed  Eye Contact:  Fair  Speech:  Clear and Coherent  Volume:  Normal  Mood:  Anxious and Irritable  Affect:  Congruent  Thought Process:  Goal Directed  Orientation:  Full (Time, Place, and Person)  Thought Content: Rumination and Tangential   Suicidal Thoughts:  No  Homicidal Thoughts:  No  Memory:  Immediate;   Good Recent;    Fair Remote;   Poor  Judgement:  Poor  Insight:  Lacking  Psychomotor Activity:  Tremor  Concentration:  Concentration: Fair and Attention Span: Fair  Recall:  FiservFair  Fund of Knowledge: Fair  Language: Good  Akathisia:  No  Handed:  Right  AIMS (if indicated): not done  Assets:  Communication Skills Desire for Improvement Resilience Social Support Talents/Skills  ADL's:  Intact  Cognition: WNL  Sleep:  Fair   Screenings:   Assessment and Plan: She is a 76 year old female with long-term history of schizophrenia.  She  insists on staying on perphenazine and refuses to try anything else.  Her hand tremors are getting worse at rest particularly.  She agrees to try propranolol 10 mg 3 times daily along with the perphenazine.  She will return to see me in 3 months   Diannia Ruder, MD 10/18/2017, 1:58 PM

## 2017-11-08 DIAGNOSIS — G252 Other specified forms of tremor: Secondary | ICD-10-CM | POA: Diagnosis not present

## 2017-11-08 DIAGNOSIS — I10 Essential (primary) hypertension: Secondary | ICD-10-CM | POA: Diagnosis not present

## 2017-11-08 DIAGNOSIS — E039 Hypothyroidism, unspecified: Secondary | ICD-10-CM | POA: Diagnosis not present

## 2017-11-08 DIAGNOSIS — R5383 Other fatigue: Secondary | ICD-10-CM | POA: Diagnosis not present

## 2017-11-08 DIAGNOSIS — M79673 Pain in unspecified foot: Secondary | ICD-10-CM | POA: Diagnosis not present

## 2017-12-04 DIAGNOSIS — M25872 Other specified joint disorders, left ankle and foot: Secondary | ICD-10-CM | POA: Diagnosis not present

## 2017-12-04 DIAGNOSIS — M79675 Pain in left toe(s): Secondary | ICD-10-CM | POA: Diagnosis not present

## 2017-12-04 DIAGNOSIS — M2012 Hallux valgus (acquired), left foot: Secondary | ICD-10-CM | POA: Diagnosis not present

## 2017-12-04 DIAGNOSIS — M2042 Other hammer toe(s) (acquired), left foot: Secondary | ICD-10-CM | POA: Diagnosis not present

## 2017-12-18 ENCOUNTER — Telehealth (HOSPITAL_COMMUNITY): Payer: Self-pay | Admitting: Psychiatry

## 2017-12-18 NOTE — Telephone Encounter (Signed)
Pt has been paranoid, calling police. Please call her to get her seen sooner

## 2017-12-23 ENCOUNTER — Telehealth (HOSPITAL_COMMUNITY): Payer: Self-pay | Admitting: Psychiatry

## 2017-12-25 ENCOUNTER — Telehealth (HOSPITAL_COMMUNITY): Payer: Self-pay | Admitting: Psychiatry

## 2018-01-16 ENCOUNTER — Ambulatory Visit (HOSPITAL_COMMUNITY): Payer: Self-pay | Admitting: Psychiatry

## 2018-01-24 ENCOUNTER — Ambulatory Visit (HOSPITAL_COMMUNITY): Payer: PPO | Admitting: Psychiatry

## 2018-01-24 ENCOUNTER — Ambulatory Visit: Payer: Self-pay | Admitting: Orthopedic Surgery

## 2018-01-27 ENCOUNTER — Emergency Department (HOSPITAL_COMMUNITY)
Admission: EM | Admit: 2018-01-27 | Discharge: 2018-01-27 | Disposition: A | Discharge status: Home / Self Care | Payer: PPO | Attending: Emergency Medicine | Admitting: Emergency Medicine

## 2018-01-27 ENCOUNTER — Emergency Department (HOSPITAL_COMMUNITY): Payer: PPO

## 2018-01-27 ENCOUNTER — Encounter (HOSPITAL_COMMUNITY): Payer: Self-pay | Admitting: Emergency Medicine

## 2018-01-27 DIAGNOSIS — Z96651 Presence of right artificial knee joint: Secondary | ICD-10-CM | POA: Insufficient documentation

## 2018-01-27 DIAGNOSIS — Z7401 Bed confinement status: Secondary | ICD-10-CM | POA: Diagnosis not present

## 2018-01-27 DIAGNOSIS — M47816 Spondylosis without myelopathy or radiculopathy, lumbar region: Secondary | ICD-10-CM

## 2018-01-27 DIAGNOSIS — Z87891 Personal history of nicotine dependence: Secondary | ICD-10-CM | POA: Diagnosis not present

## 2018-01-27 DIAGNOSIS — Z79899 Other long term (current) drug therapy: Secondary | ICD-10-CM | POA: Diagnosis not present

## 2018-01-27 DIAGNOSIS — M79645 Pain in left finger(s): Secondary | ICD-10-CM | POA: Diagnosis not present

## 2018-01-27 DIAGNOSIS — M545 Low back pain: Secondary | ICD-10-CM | POA: Diagnosis not present

## 2018-01-27 DIAGNOSIS — R279 Unspecified lack of coordination: Secondary | ICD-10-CM | POA: Diagnosis not present

## 2018-01-27 DIAGNOSIS — M4696 Unspecified inflammatory spondylopathy, lumbar region: Secondary | ICD-10-CM | POA: Diagnosis not present

## 2018-01-27 DIAGNOSIS — F209 Schizophrenia, unspecified: Secondary | ICD-10-CM | POA: Insufficient documentation

## 2018-01-27 DIAGNOSIS — Z9049 Acquired absence of other specified parts of digestive tract: Secondary | ICD-10-CM | POA: Insufficient documentation

## 2018-01-27 DIAGNOSIS — G2119 Other drug induced secondary parkinsonism: Secondary | ICD-10-CM | POA: Insufficient documentation

## 2018-01-27 DIAGNOSIS — M1288 Other specific arthropathies, not elsewhere classified, other specified site: Secondary | ICD-10-CM | POA: Diagnosis not present

## 2018-01-27 DIAGNOSIS — M25551 Pain in right hip: Secondary | ICD-10-CM

## 2018-01-27 MED ORDER — HYDROCODONE-ACETAMINOPHEN 5-325 MG PO TABS: 1.0000 | ORAL_TABLET | Freq: Four times a day (QID) | ORAL | 0 refills | Status: DC | PRN

## 2018-01-27 MED ORDER — MORPHINE SULFATE (PF) 4 MG/ML IV SOLN
4.0000 mg | Freq: Once | INTRAVENOUS | Status: AC
  Administered 2018-01-27: 4 mg via INTRAVENOUS
  Filled 2018-01-27: qty 1

## 2018-01-27 NOTE — ED Notes (Signed)
Pt waiting on EMS

## 2018-01-27 NOTE — ED Provider Notes (Signed)
[facility] EMERGENCY DEPARTMENT Provider Note   CSN: [id number] Arrival date & time: 01/27/18  1410     History   Chief Complaint Chief Complaint  Patient presents with  . Hip Pain    HPI Sherry Navarro is a 77 y.o. female.  HPI The patient presents to the emergency room for evaluation of right hip pain.  Patient states she has been having trouble with her right hip for the last several weeks.  The pain has increased significantly over the last 3 days.  She made an appointment to see her orthopedic doctor and has an appointment on Monday but today it was hurting too much for her to wait until then.  Patient does have a history of left-sided hip pain.  Patient states it is from bone spurs.  Dr. [name] will do joint injections for that pain.  Patient does not recall any recent falls or injuries.  Her pain is in the posterior aspect of the right hip and sometimes she has some pain in her right leg.  Or weakness.  No redness or swelling.  Pain increases with certain movements and positions.  She has prescribed oral medication including tramadol and nabumetone.  She has been taking that medication and but unfortunately it has not helped. Past Medical History:  Diagnosis Date  . AC (acromioclavicular) joint bone spurs   . Hiatal hernia   . High cholesterol   . Osteoarthritis   . Schizophrenia (HCC)   . Seizures (HCC)   . Thyroid disorder     Patient Active Problem List   Diagnosis Date Noted  . 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 Schizophrenia (HCC) 10/02/2012  . Bursitis, hip 05/03/2011  . SCIATICA 09/29/2010  . HAND, ARTHRITIS, DEGEN./OSTEO 05/25/2009  . HIP PAIN 11/19/2007    Past Surgical History:  Procedure Laterality Date  . back for spinal stenonsis not by Dr. [name]    . CHOLECYSTECTOMY    . rt knee replacement not by Dr. [name]       OB History   None      Home Medications    Prior to Admission medications   Medication Sig Start Date End  Date Taking? Authorizing Provider  Cimetidine (TAGAMET PO) Take 300 mg by mouth at bedtime.     [provider]  HYDROcodone-acetaminophen (NORCO/VICODIN) 5-325 MG tablet Take 1 tablet by mouth every 6 (six) hours as needed. 01/27/18   Linwood DibblesKnapp, Chrissi Crow, MD  levothyroxine (SYNTHROID, LEVOTHROID) 25 MCG tablet Take 25 mcg by mouth daily before breakfast.    [provider]  Multiple Vitamins-Minerals (ONE-A-DAY WOMENS 50 PLUS PO) Take 1 tablet by mouth daily.    [provider]  NABUMETONE PO Take 750 mg by mouth daily. Reported on 12/15/2015    [provider]  perphenazine (TRILAFON) 8 MG tablet Take 2 tablets (16 mg total) by mouth at bedtime. 10/18/17   Myrlene Brokeross, Deborah R, MD  PREDNISONE PO Take by mouth daily.    [provider]  propranolol (INDERAL) 10 MG tablet Take 1 tablet (10 mg total) by mouth 3 (three) times daily. 10/18/17   Myrlene Brokeross, Deborah R, MD  traMADol (ULTRAM) 50 MG tablet Take 50 mg by mouth every 6 (six) hours as needed.    [provider]    Family History Family History  Problem Relation Age of Onset  . Depression Other   . Schizophrenia Neg Hx   . ADD / ADHD Neg Hx   . Alcohol abuse Neg  Hx   . Drug abuse Neg Hx   . Anxiety disorder Neg Hx   . Bipolar disorder Neg Hx   . Dementia Neg Hx   . OCD Neg Hx   . Paranoid behavior Neg Hx   . Seizures Neg Hx   . Sexual abuse Neg Hx   . Physical abuse Neg Hx     Social History Social History   Tobacco Use  . Smoking status: Former Games developer  . Smokeless tobacco: Never Used  Substance Use Topics  . Alcohol use: No  . Drug use: No     Allergies   Thorazine [chlorpromazine]   Review of Systems Review of Systems  All other systems reviewed and are negative.    Physical Exam Updated Vital Signs BP 100/68   Pulse 79   Temp 98 F (36.7 C) (Oral)   Resp 18   Ht 1.702 m (5\' 7" )   Wt 96.2 kg (212 lb)   SpO2 98%   BMI 33.20 kg/m   Physical Exam  Constitutional:  She appears well-developed and well-nourished. No distress.  HENT:  Head: Normocephalic and atraumatic.  Right Ear: External ear normal.  Left Ear: External ear normal.  Eyes: Conjunctivae are normal. Right eye exhibits no discharge. Left eye exhibits no discharge. No scleral icterus.  Neck: Neck supple. No tracheal deviation present.  Cardiovascular: Normal rate.  Pulmonary/Chest: Effort normal. No stridor. No respiratory distress.  Abdominal: She exhibits no distension.  Musculoskeletal: She exhibits tenderness. She exhibits no edema or deformity.       Right hip: She exhibits tenderness. She exhibits no swelling, no crepitus and no deformity.       Lumbar back: She exhibits no bony tenderness.  Strong dorsalis pedis pulses bilaterally, feet are warm and well perfused, no erythema  Neurological: She is alert. Cranial nerve deficit: no gross deficits.  Skin: Skin is warm and dry. No rash noted.  Psychiatric: She has a normal mood and affect.  Nursing note and vitals reviewed.    ED Treatments / Results  Labs (all labs ordered are listed, but only abnormal results are displayed) Labs Reviewed - No data to display  EKG None  Radiology Dg Lumbar Spine Complete  Result Date: 01/27/2018 CLINICAL DATA:  Low back pain EXAM: LUMBAR SPINE - COMPLETE 4+ VIEW COMPARISON:  None. FINDINGS: Grade 1 anterolisthesis at L4-L5, probably secondary to facet hypertrophy. There is severe facet hypertrophy from L3-S1. There is transitional lumbosacral anatomy with bilateral assimilation joints at L5. No acute fracture. Vertebral body heights and disc spaces are maintained. IMPRESSION: 1. 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 2. Multilevel severe facet arthrosis, from L3-S1, with resultant grade 1 L4-5 anterolisthesis. Electronically Signed   By: Deatra Robinson M.D.   On: 01/27/2018 17:00   Dg Hip Unilat With Pelvis 2-3 Views Right  Result Date: 01/27/2018 CLINICAL  DATA:  Right hip and low back pain.  No reported injury. EXAM: DG HIP (WITH OR WITHOUT PELVIS) 2-3V RIGHT COMPARISON:  None. FINDINGS: No pelvic fracture or diastasis. No right hip fracture or dislocation. Marked lower lumbar spondylosis. Mild bilateral hip osteoarthritis. No suspicious focal osseous lesions. IMPRESSION: No fracture. No right hip malalignment. Prominent degenerative changes in the visualized lower lumbar spine. Mild bilateral hip osteoarthritis. Electronically Signed   By: Delbert Phenix M.D.   On: 01/27/2018 16:56    Procedures Procedures (including critical care time)  Medications Ordered in ED Medications  morphine 4 MG/ML injection 4 mg (has  no administration in time range)  morphine 4 MG/ML injection 4 mg (4 mg Intravenous Given 01/27/18 1559)     Initial Impression / Assessment and Plan / ED Course  I have reviewed the triage vital signs and the nursing notes.  Pertinent labs & imaging results that were available during my care of the patient were reviewed by me and considered in my medical decision making (see chart for details).  Clinical Course as of Jan 27 1718  Sat Jan 27, 2018  1713 Pain somewhat better but does return with certain movements.  Will give an additional dose   [JK]  1716 [place] PMP: Regular Tramadaol rx, last on 2/25   [JK]  1717 DG Hip Unilat With Pelvis 2-3 Views Right [JK]    Clinical Course User Index [JK] Linwood Dibbles, MD    Patient presents emergency room with complaints of right hip pain.  I suspect her symptoms are related to her degenerative arthritis in the spine rather than her arthritis in her hip.  Patient has no focal neurologic deficits.  I do not think there is any indication for any emergent intervention.  Patient was given doses of pain medications.  She has had some improvement.  I will give her prescription for hydrocodone.  Think is reasonable follow-up with her orthopedic doctor on Monday.  She may benefit from further evaluation  such as outpatient MRI.  Final Clinical Impressions(s) / ED Diagnoses   Final diagnoses:  Lumbar facet arthropathy  Pain of right hip joint    ED Discharge Orders        Ordered    HYDROcodone-acetaminophen (NORCO/VICODIN) 5-325 MG tablet  Every 6 hours PRN     01/27/18 1714       Linwood Dibbles, MD 01/27/18 1750

## 2018-01-27 NOTE — Discharge Instructions (Signed)
Follow up with your primary doctor and Dr. [name] as planned.  Discussed that further evaluation is necessary such as a spine MRI.  Take the medications as needed for pain.

## 2018-01-27 NOTE — ED Notes (Signed)
Pt refused to change into hospital gown due to pain.

## 2018-01-27 NOTE — ED Triage Notes (Signed)
Patient complaining of right hip pain x 3 days. Denies injury.

## 2018-01-27 NOTE — ED Notes (Signed)
Called [facility] for a ride back home. 

## 2018-01-29 ENCOUNTER — Encounter: Payer: Self-pay | Admitting: Orthopedic Surgery

## 2018-01-29 ENCOUNTER — Ambulatory Visit: Payer: PPO | Admitting: Orthopedic Surgery

## 2018-01-29 VITALS — BP 150/102 | HR 76 | Ht 67.0 in | Wt 210.0 lb

## 2018-01-29 DIAGNOSIS — G8929 Other chronic pain: Secondary | ICD-10-CM | POA: Diagnosis not present

## 2018-01-29 DIAGNOSIS — M25552 Pain in left hip: Secondary | ICD-10-CM

## 2018-01-29 DIAGNOSIS — M5416 Radiculopathy, lumbar region: Secondary | ICD-10-CM | POA: Diagnosis not present

## 2018-01-29 DIAGNOSIS — IMO0001 Reserved for inherently not codable concepts without codable children: Secondary | ICD-10-CM

## 2018-01-29 MED ORDER — PREDNISONE 10 MG (48) PO TBPK: ORAL_TABLET | Freq: Every day | ORAL | 1 refills | Status: DC

## 2018-01-29 MED FILL — Hydrocodone-Acetaminophen Tab 5-325 MG: ORAL | Qty: 6 | Status: AC

## 2018-01-29 NOTE — Progress Notes (Addendum)
Chief Complaint  Patient presents with  . Back Pain    low back pain (patient also states hip, but points to back)    Encounter Diagnosis  Name Primary?  . Chronic left hip pain Yes    A steroid injection was performed at RIGHT  gluteus  using 1% plain Lidocaine and depomedrol 40 mg of  This was well tolerated.  New complaint 77 year old female status post discectomy history of chronic left lower back pain received injections every 3 months comes in with acute onset of right lower back pain radiating to right leg associated with poor gait.  The pain is primarily a dull ache in the lower back and runs into the right leg present now for several days went to the ER as well as her primary care doctor.  She was started on tramadol by primary care hydrocodone by emergency room.  Note morphine was needed to relieve the pain in the emergency room last evening  Review of Systems  Respiratory: Negative.   Cardiovascular: Negative.   Neurological: Positive for tingling, tremors and sensory change. Negative for focal weakness and weakness.   Past Medical History:  Diagnosis Date  . AC (acromioclavicular) joint bone spurs   . Hiatal hernia   . High cholesterol   . Osteoarthritis   . Schizophrenia (HCC)   . Seizures (HCC)   . Thyroid disorder    BP (!) 150/102   Pulse 76   Ht 5\' 7"  (1.702 m)   Wt 210 lb (95.3 kg)   BMI 32.89 kg/m  Physical Exam  Constitutional: She is oriented to person, place, and time. She appears well-developed and well-nourished.  Neurological: She is alert and oriented to person, place, and time. She displays tremor. She displays no atrophy. She exhibits normal muscle tone. She displays no seizure activity. Coordination and gait abnormal.  Psychiatric: She has a normal mood and affect. Judgment normal.  Vitals reviewed.  Patient comes in using a wheelchair and a cane.  She has no major weakness in the right or left leg.  Her hip range of motion is normal on each  side both hips are stable she has adequate strength and no atrophy or weakness in her lower extremities skin on both sides is normal good pulse and perfusion mild edema both legs  No sensory changes are noted  Straight leg raises are negative  FINDINGS: Grade 1 anterolisthesis at L4-L5, probably secondary to facet hypertrophy. There is severe facet hypertrophy from L3-S1. There is transitional lumbosacral anatomy with bilateral assimilation joints at L5. No acute fracture. Vertebral body heights and disc spaces are maintained.  IMPRESSION: 1. 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 2. Multilevel severe facet arthrosis, from L3-S1, with resultant grade 1 L4-5 anterolisthesis.   Electronically Signed   By: Deatra Robinson M.D.   On: 01/27/2018 17:00   Interpretive images spine multilevel severe facet arthrosis starting at L3 going through S1 right 1 anterolisthesis L4 on 5  X-rays of the hips are normal in terms of arthritis  EXAM: DG HIP (WITH OR WITHOUT PELVIS) 2-3V RIGHT   COMPARISON:  None.   FINDINGS: No pelvic fracture or diastasis. No right hip fracture or dislocation. Marked lower lumbar spondylosis. Mild bilateral hip osteoarthritis. No suspicious focal osseous lesions.   IMPRESSION: No fracture. No right hip malalignment. Prominent degenerative changes in the visualized lower lumbar spine. Mild bilateral hip osteoarthritis.     Electronically Signed   By: Jannifer Rodney.D.  On: 01/27/2018 16:56    I injected her right side IM shot of steroids  Start steroid prednisone taper  Come back 6 weeks

## 2018-01-29 NOTE — Addendum Note (Signed)
Addended by: Fuller CanadaHARRISON, Ellarose Brandi E on: 01/29/2018 02:12 PM   Modules accepted: Orders, Level of Service

## 2018-01-30 ENCOUNTER — Telehealth: Payer: Self-pay | Admitting: Orthopedic Surgery

## 2018-01-30 NOTE — Telephone Encounter (Signed)
Patient had Depomedrol injection in the office yesterday, I called her and asked her to give this more time to work, she agrees and will give it more time./ I told her may be another week before she notices improvement.  To you FYI

## 2018-01-30 NOTE — Telephone Encounter (Signed)
Ms. Sherry Navarro called and stated she was still hurting a lot and wants a shot in her back.  She said Dr .[name] told her that he could not do this in the office and she wanted to know if he would do this in the emergency room.  I did tell her that he does not give shots in the back but reminded her that he did give her a shot in her hip yesterday.  She also stated that the Prednisone is not helping.    She wants to talk to someone about what else she can do to get some relief.  Please call her  Thanks

## 2018-01-31 ENCOUNTER — Telehealth (HOSPITAL_COMMUNITY): Payer: Self-pay | Admitting: *Deleted

## 2018-01-31 ENCOUNTER — Telehealth: Payer: Self-pay | Admitting: Orthopedic Surgery

## 2018-01-31 ENCOUNTER — Other Ambulatory Visit (HOSPITAL_COMMUNITY): Payer: Self-pay | Admitting: Psychiatry

## 2018-01-31 DIAGNOSIS — F202 Catatonic schizophrenia: Secondary | ICD-10-CM

## 2018-01-31 MED ORDER — PERPHENAZINE 8 MG PO TABS: 16.0000 mg | ORAL_TABLET | Freq: Every day | ORAL | 0 refills | Status: DC

## 2018-01-31 NOTE — Telephone Encounter (Signed)
Dr [name] gave her a steroid dose pack, this is all he wants her to use, I will call her when clinic is over to advise.

## 2018-01-31 NOTE — Telephone Encounter (Signed)
Dr Tenny Crawoss  Patient called in for refill on Perphenazine. Next appointment 02/05/18

## 2018-01-31 NOTE — Telephone Encounter (Signed)
sent 

## 2018-01-31 NOTE — Telephone Encounter (Signed)
Patient left message on voicemail stating that she saw Dr. [name] on Monday and he gave her a shot, put her on Prednisone. She states that when she went to the ER they gave her some Hydrocodone. When she takes it she states it irritates her and makes her hurt worse. She is asking if Dr. [name] will call her in something else. She didn't leave her pharmacy name, but in her chart I see it is [facility]

## 2018-02-01 NOTE — Telephone Encounter (Signed)
Whuh?! Lol its a 12 day dose pack that started on the 2 nd ????

## 2018-02-01 NOTE — Telephone Encounter (Signed)
Thank you I called her to advise.  

## 2018-02-01 NOTE — Telephone Encounter (Signed)
She wants to know what you want her to use after the steroid is finished

## 2018-02-01 NOTE — Telephone Encounter (Signed)
Maybe nothing might be well

## 2018-02-01 NOTE — Telephone Encounter (Signed)
I spoke to patient to advise her / she is asking about continuing the prednisone for longer than Dr [name] has given her. I told her long term prednisone has some side effects she may not like. She has used Relafen in the past, but she tells me it did not help much  She wants to know what you want her to use after the steroid is finished  She reluctantly admitted she does feel a little tiny bit better.

## 2018-02-05 ENCOUNTER — Ambulatory Visit (HOSPITAL_COMMUNITY): Payer: PPO | Admitting: Psychiatry

## 2018-02-05 ENCOUNTER — Telehealth: Payer: Self-pay | Admitting: Orthopedic Surgery

## 2018-02-05 NOTE — Telephone Encounter (Signed)
Patient called stating that she hurts every time she takes the Prednisone. She is asking what can she do and does she need to continue the Prednisone.  Please call and advise

## 2018-02-06 NOTE — Telephone Encounter (Signed)
Yes, she should continue the prednisone taper. I called her to advise.   To you FYI

## 2018-02-07 ENCOUNTER — Telehealth: Payer: Self-pay | Admitting: Orthopedic Surgery

## 2018-02-07 NOTE — Telephone Encounter (Signed)
Dr Ophelia CharterYates can see her for surgical evaluation but I dont do that kind of surgery   She can go to er if needed

## 2018-02-07 NOTE — Telephone Encounter (Signed)
I have called her to advise. No answer. No voice mail

## 2018-02-07 NOTE — Telephone Encounter (Signed)
Ms. Sherry Navarro called her again this morning.  She was very upset and was saying that she needed surgery.  She said she couldn't do anything because she hurt so much.  I did ask her if she was taking her medication and she said she was but it wasn't helping.  She said she needs surgery.    Please call her and advise her on what she should do  Thanks

## 2018-02-08 ENCOUNTER — Other Ambulatory Visit: Payer: Self-pay

## 2018-02-08 NOTE — Patient Outreach (Addendum)
[facility] Care Management  02/08/2018  Sherry Navarro 06/24/1941 [id number]   TELEPHONE SCREENING Referral date: 01/31/18 [facility]erral source[facility] Referral reason: emergency room visit on 01/27/18. Patient having difficulty caring for herself.  New medications given at ED visit Insurance: [employer].   Telephone call to patient regarding utilization management referral. Patient did not verify HIPAA. Patient request call back at another time.   PLAN: RNCM will attempt 2nd telephone outreach to patient within 4 business days.  RNCM will send outreach letter to attempt contact.   George InaDavina Donaldo Teegarden RN,BSN,CCM [facility] Telephonic  [phone]

## 2018-02-14 ENCOUNTER — Other Ambulatory Visit: Payer: Self-pay

## 2018-02-14 NOTE — Patient Outreach (Signed)
[facility]) Care Management  02/14/2018  Sherry Navarro 03/06/1941 [id number]   TELEPHONE SCREENING Referral date: 01/31/18 Referral source: [facility] Utilization management Referral reason: emergency room visit on 01/27/18. Patient having difficulty caring for herself.  New medications given at ED visit Insurance: Health team advantage.  Attempt #2  Telephone call to patient regarding utilization management referral. Contact answering phone states patient is not available at this time.  Contact would not take contact name and phone number of RNCM.  PLAN:  RNCM will attempt 3rd telephone outreach within  4 business days.   Sherry InaDavina Adaleigh Warf RN,BSN,CCM [facility] Telephonic  [phone]

## 2018-02-20 ENCOUNTER — Other Ambulatory Visit: Payer: Self-pay

## 2018-02-20 NOTE — Patient Outreach (Signed)
[facility]) Care Management  02/20/2018  Marti Sleighancy C Riggin 06/11/1941 [id number]  TELEPHONE SCREENING Referral date:01/31/18 Referral source:[facility] Utilization management Referral reason:emergency room visit on 01/27/18. Patient having difficulty caring for herself. New medications given at ED visit Insurance:Health team advantage.  Attempt #3  Telephone call to patient regarding utilization review referral. Unable to reach patient. HIPAA compliant voice message left with call back phone number.   PLAN: If not response from patient will proceed with closure.   George InaDavina Maddilyn Campus RN,BSN,CCM [facility] Telephonic  [phone]

## 2018-02-21 ENCOUNTER — Other Ambulatory Visit: Payer: Self-pay

## 2018-02-21 NOTE — Patient Outreach (Signed)
[facility]) Care Management  02/21/2018  Marti Sleighancy C Krukowski 03/10/1941 [id number]  Case closure No response from patient after 3 telephone calls and outreach letter attempt  PLAN: RNCM will close patient due to being unable to contact.  RNCM will send closure notification to patients primary MD .   George Inaavina Jumana Paccione RN,BSN,CCM [facility] Telephonic  [phone]

## 2018-02-23 ENCOUNTER — Other Ambulatory Visit (HOSPITAL_COMMUNITY): Payer: Self-pay | Admitting: Psychiatry

## 2018-02-23 ENCOUNTER — Telehealth (HOSPITAL_COMMUNITY): Payer: Self-pay | Admitting: *Deleted

## 2018-02-23 DIAGNOSIS — F202 Catatonic schizophrenia: Secondary | ICD-10-CM

## 2018-02-23 MED ORDER — PERPHENAZINE 8 MG PO TABS: 16.0000 mg | ORAL_TABLET | Freq: Every day | ORAL | 0 refills | Status: DC

## 2018-02-23 NOTE — Telephone Encounter (Signed)
sent 

## 2018-02-23 NOTE — Telephone Encounter (Signed)
Dr Tenny Crawoss Patient called front office LVM requesting refills on her medication. Next appointment is 03/19/18

## 2018-03-12 ENCOUNTER — Encounter: Payer: Self-pay | Admitting: Orthopedic Surgery

## 2018-03-12 ENCOUNTER — Ambulatory Visit: Payer: PPO | Admitting: Orthopedic Surgery

## 2018-03-12 VITALS — BP 124/79 | HR 82 | Ht 67.0 in | Wt 224.0 lb

## 2018-03-12 DIAGNOSIS — M25552 Pain in left hip: Secondary | ICD-10-CM

## 2018-03-12 DIAGNOSIS — G8929 Other chronic pain: Secondary | ICD-10-CM

## 2018-03-12 DIAGNOSIS — M5416 Radiculopathy, lumbar region: Secondary | ICD-10-CM | POA: Diagnosis not present

## 2018-03-12 DIAGNOSIS — IMO0001 Reserved for inherently not codable concepts without codable children: Secondary | ICD-10-CM

## 2018-03-12 NOTE — Progress Notes (Signed)
Progress Note   Patient ID: Sherry Navarro, female   DOB: January 18, 1941, 77 y.o.   MRN: [id number]  Chief Complaint  Patient presents with  . Follow-up    6 week recheck on back pain with injection.     Medical decision-making Encounter Diagnoses  Name Primary?  . Chronic left hip pain Yes  . Radicular pain of right lower back    No orders of the defined types were placed in this encounter.  PLAN:   Inject right hip  Observe left hip  If no improvement she will contact continue and we will get an MRI she will let us know  She would like her left hip injected as we have been doing  Inject left hip sterile technique alcohol with ethyl chloride verbal consent site confirmation 25 mg of Depo-Medrol 3 cc 1% lidocaine injected   Chief Complaint  Patient presents with  . Follow-up    6 week recheck on back pain with injection.    New complaint 77 year old female status post discectomy history of chronic left lower back pain received injections every 3 months comes in with acute onset of right lower back pain radiating to right leg associated with poor gait.  The pain is primarily a dull ache in the lower back and runs into the right leg present now for several days went to the ER as well as her primary care doctor.  She was started on tramadol by primary care hydrocodone by emergency room.  Note morphine was needed to relieve the pain in the emergency room last evening   Review of Systems  Respiratory: Negative.   Cardiovascular: Negative.   Neurological: Positive for tingling, tremors and sensory change. Negative for focal weakness and weakness.   We gave her injection and put her on steroids orally   She says that she is only slightly improved  She does better with ibuprofen 2 tablets every 4      ROS  Bowel bladder function still intact no radicular pain No outpatient medications have been marked as taking for the 03/12/18 encounter (Office Visit) with Vickki Hearing,  MD.    Allergies  Allergen Reactions  . Thorazine [Chlorpromazine] Other (See Comments)    seizures     BP 124/79   Pulse 82   Ht  (1.702 m)   Wt 224 lb (101.6 kg)   BMI 35.08 kg/m    Fuller Canada, MD 03/12/2018 1:51 PM

## 2018-03-19 ENCOUNTER — Ambulatory Visit (HOSPITAL_COMMUNITY): Payer: Self-pay | Admitting: Psychiatry

## 2018-04-02 ENCOUNTER — Ambulatory Visit (HOSPITAL_COMMUNITY): Payer: PPO | Admitting: Psychiatry

## 2018-04-02 ENCOUNTER — Encounter (HOSPITAL_COMMUNITY): Payer: Self-pay | Admitting: Psychiatry

## 2018-04-02 DIAGNOSIS — Z87891 Personal history of nicotine dependence: Secondary | ICD-10-CM

## 2018-04-02 DIAGNOSIS — F202 Catatonic schizophrenia: Secondary | ICD-10-CM | POA: Diagnosis not present

## 2018-04-02 DIAGNOSIS — Z79899 Other long term (current) drug therapy: Secondary | ICD-10-CM

## 2018-04-02 DIAGNOSIS — Z818 Family history of other mental and behavioral disorders: Secondary | ICD-10-CM

## 2018-04-02 DIAGNOSIS — M549 Dorsalgia, unspecified: Secondary | ICD-10-CM

## 2018-04-02 MED ORDER — PERPHENAZINE 8 MG PO TABS: 16.0000 mg | ORAL_TABLET | Freq: Every day | ORAL | 2 refills | Status: DC

## 2018-04-02 NOTE — Progress Notes (Signed)
BH MD/PA/NP OP Progress Note  04/02/2018 1:47 PM KADAJAH KJOS  MRN:  [id number]  Chief Complaint:  Chief Complaint    Schizophrenia; Follow-up     HPI: This patient is a 77 year old widowed white female who lives with her sister in [place]. She used to work as a Tree surgeon  The patient states that she has had mental illness since her early 18s. She was not married to a man was running around on her and she had a breakdown and was diagnosed with schizophrenia. We don't have any documentation of this. She was on Navane for years but it was no longer being manufactured to several months ago she was put on perphenazine 16 mg per day. She has terrible shaking in her hands and some in her feet which is probably secondary to these neuroleptic medications. She claims she had this shaking even before she was on medicine but that it's difficult to believe. She claims that she tries to get off these medicines she gets terrible headaches but I think we need to try given her shakiness. She claims she's tried Inderal Cogentin and benzodiazepines but nothing has helped.  Currently she has no paranoia auditory or visual hallucinations depression or suicidal ideation. She her sister go shopping every day and ought to be and she seems happy with her current lifestyle  Patient returns after about 6 months.  She states that her hip and back pain are getting worse.  She is been getting injections from Dr. Romeo Apple in orthopedics and he wants her to get an MRI.  She claims that she cannot afford the co-pay for the MRI or forward to going to surgery.  She is afraid that her sister would get rid of her cat when she was gone.  Overall she states her mood is okay but her tremors in her hands seem worse than ever.  She is refused to try any medications to help with this.  She still takes the perphenazine and denies any auditory visual hallucinations or paranoia.  She does not want to try any other antipsychotics  Visit  Diagnosis:    ICD-10-CM   1. 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 AC (acromioclavicular) joint bone spurs   . Hiatal hernia   . High cholesterol   . Osteoarthritis   . Schizophrenia (HCC)   . Seizures (HCC)   . Thyroid disorder     Past Surgical History:  Procedure Laterality Date  . back for spinal stenonsis not by Dr. Romeo Apple    . CHOLECYSTECTOMY    . rt knee replacement not by Dr. Romeo Apple      Family Psychiatric History: See below  Family History:  Family History  Problem Relation Age of Onset  . Depression Other   . Schizophrenia Neg Hx   . ADD / ADHD Neg Hx   . Alcohol abuse Neg Hx   . Drug abuse Neg Hx   . Anxiety disorder Neg Hx   . Bipolar disorder Neg Hx   . Dementia Neg Hx   . OCD Neg Hx   . Paranoid behavior Neg Hx   . Seizures Neg Hx   . Sexual abuse Neg Hx   . Physical abuse Neg Hx     Social History:  Social History   Socioeconomic History  .  Marital status: Widowed    Spouse name: Not on file  . Number of children: Not on file  . Years of education: Not on file  . Highest education level: Not on file  Occupational History  . Not on file  Social Needs  . Financial resource strain: Not on file  . Food insecurity:    Worry: Not on file    Inability: Not on file  . Transportation needs:    Medical: Not on file    Non-medical: Not on file  Tobacco Use  . Smoking status: Former Games developermoker  . Smokeless tobacco: Never Used  Substance and Sexual Activity  . Alcohol use: No  . Drug use: No  . Sexual activity: Never  Lifestyle  . Physical activity:    Days per week: Not on file    Minutes per session: Not on file  . Stress: Not on file  Relationships  . Social connections:    Talks on phone: Not on file     Gets together: Not on file    Attends religious service: Not on file    Active member of club or organization: Not on file    Attends meetings of clubs or organizations: Not on file    Relationship status: Not on file  Other Topics Concern  . Not on file  Social History Narrative  . Not on file    Allergies:  Allergies  Allergen Reactions  . Thorazine [Chlorpromazine] Other (See Comments)    seizures    Metabolic Disorder Labs: Lab Results  Component Value Date   HGBA1C 5.5 04/03/2012   MPG 111 04/03/2012   No results found for: PROLACTIN No results found for: CHOL, TRIG, HDL, CHOLHDL, VLDL, LDLCALC No results found for: TSH  Therapeutic Level Labs: No results found for: LITHIUM No results found for: VALPROATE No components found for:  CBMZ  Current Medications: Current Outpatient Medications  Medication Sig Dispense Refill  . Cimetidine (TAGAMET PO) Take 300 mg by mouth at bedtime.     Marland Kitchen. HYDROcodone-acetaminophen (NORCO/VICODIN) 5-325 MG tablet Take 1 tablet by mouth every 6 (six) hours as needed. 6 tablet 0  . HYDROcodone-acetaminophen (NORCO/VICODIN) 5-325 MG tablet Take 1 tablet by mouth every 6 (six) hours as needed. 12 tablet 0  . levothyroxine (SYNTHROID, LEVOTHROID) 25 MCG tablet Take 25 mcg by mouth daily before breakfast.    . Multiple Vitamins-Minerals (ONE-A-DAY WOMENS 50 PLUS PO) Take 1 tablet by mouth daily.    Marland Kitchen. NABUMETONE PO Take 750 mg by mouth daily. Reported on 12/15/2015    . perphenazine (TRILAFON) 8 MG tablet Take 2 tablets (16 mg total) by mouth at bedtime. 180 tablet 2  . predniSONE (STERAPRED UNI-PAK 48 TAB) 10 MG (48) TBPK tablet Take by mouth daily. 10 mg double strength Dosepak 12 days 48 tablet 1  . traMADol (ULTRAM) 50 MG tablet Take 50 mg by mouth every 6 (six) hours as needed.     No current facility-administered medications for this visit.      Musculoskeletal: Strength & Muscle Tone: decreased Gait & Station: unsteady Patient  leans: N/A  Psychiatric Specialty Exam: Review of Systems  Musculoskeletal: Positive for back pain and joint pain.  Neurological: Positive for tremors.  Psychiatric/Behavioral: The patient is nervous/anxious.   All other systems reviewed and are negative.   Blood pressure 140/80, pulse 94, height 5\' 7"  (1.702 m), weight 226 lb (102.5 kg), SpO2 98 %.Body mass index is 35.4 kg/m.  General Appearance: Casual, Neat  and Well Groomed  Eye Contact:  Good  Speech:  Clear and Coherent  Volume:  Normal  Mood:  Anxious  Affect:  Congruent  Thought Process:  Goal Directed  Orientation:  Full (Time, Place, and Person)  Thought Content: Logical   Suicidal Thoughts:  No  Homicidal Thoughts:  No  Memory:  Immediate;   Good Recent;   Fair Remote;   Poor  Judgement:  Poor  Insight:  Lacking  Psychomotor Activity:  Tremor  Concentration:  Concentration: Fair and Attention Span: Fair  Recall:  [employer] of Knowledge: Fair  Language: Good  Akathisia:  No  Handed:  Right  AIMS (if indicated): not done  Assets: Communication skills, social support  ADL's:  Intact  Cognition: WNL  Sleep:  Good   Screenings:   Assessment and Plan: This patient is a 77 year old female with a long-term history of schizophrenia.  She refuses to go off Trilafon 16 mg daily.  Consequently she has developed a resting tremor in both hands which she also refuses to take treatment for this as well.  I have explained the risks and benefits of remaining on Trilafon but she declines any further interventions.  Since she is not currently psychotic or dangerous to self or others we cannot force her to do anything else.  She will continue this dosage and return to see me in 6 months   Diannia Ruder, MD 04/02/2018, 1:47 PM

## 2018-04-19 ENCOUNTER — Other Ambulatory Visit: Payer: Self-pay

## 2018-04-19 NOTE — Patient Outreach (Signed)
[facility]) Care Management  04/19/2018  Sherry Navarro 03/07/1941 [id number]   Referral Date: 04/18/18 Referral Source: [facility]TA concierge Referral Reason: Needing transportation   Outreach Attempt#1 Telephone call to patient for HTA referral. Patient is able to verify HIPAA. Discussed with patient reason for referral. Patient states she has problems getting around and needs to see an orthopedic surgeon for her back.  She states that HTA concierge is working on getting her an in Electrical engineernetwork physician and will be calling her back today.     Social: Patient lives with sister who is 10246 years old.  Sister is unable to assist with care.  Patient reports however sister does get her meals.  Patient reports that she is unable to bath herself independently due to back problems.  Patient reports that she sponges herself off.  Patient ambulates with a cane.  Patient had a bad fall back in September 2018 but has not had a fall again but has had problems with her back.    Conditions: Patient admits to chronic back problems that cause her a lot of pain and she takes ibuprofen for pain. Patient also reports thyroid problems, anxiety, and HTN. Patient states she does not take anything for HTN anymore as her blood pressure under good control.     Medications:  Patient takes her medications as prescribed and voices no concerns.  Appointments:  Patient reports she is not sure the last time she saw her primary care physician.  Patient reports that she will be being referred to an orthopedic surgeon but not sure if she will have transportation.  She states that HTA will be getting her a Careers advisersurgeon in network.  Ad[facility] that [facility].  She verbalized understanding and agrees to call once appointment is set up.     Consent: D[facility]ed with [facility].  Patient declined services at this time but will call for transportation resources when appointment is set.     Plan: RN CM will send letter and brochure and close case.     Bary Lericheionne J Caroll Weinheimer, RN, MSN [facility] Care [facility] Care Management Coordinator Direct Line [phone] Toll Free: [phone]  Fax: [phone]

## 2018-04-20 ENCOUNTER — Other Ambulatory Visit: Payer: Self-pay

## 2018-04-20 NOTE — Patient Outreach (Signed)
[facility]) Care Management  04/20/2018  Sherry Navarro 10/16/1941 [id number]   Telephone call to patient for follow up.  Patient able to verify HIPAA. Patient states that the concierge did not call her back to help her with orthopedic surgeon in network.  Checked HTA site for in network physicians. Gave patient [name] and [name] [name] as in Forensic psychologistnetwork physicians.  Patient took the information down and will be calling.  Patient mentioned that Dr. [name] advised her that if her back was no better that she would need an MRI.  Advised patient that she needed to let Dr. [name] know that she is no better and MRI needed to be scheduled. She verbalized understanding and will call the office.  Patient expressed need for transportation to the appointments when made.  Advised patient to contact CM when she has a date and time to assist with transportation needs.  Patient states that sometimes her back hurts her so bad.  Advised patient that she needed to go to Urgent Care or the ER to be evaluated if her pain was not controlled.   She verbalized understanding.      Plan: Patient to call CM back for transportation needs.   RN CM will wait for patient return call.    Bary Lericheionne J Tamesha Ellerbrock, RN, MSN [facility] Care Management Care Management Coordinator Direct Line [phone] Toll Free: [phone]  Fax: [phone]

## 2018-04-23 ENCOUNTER — Telehealth: Payer: Self-pay | Admitting: Orthopedic Surgery

## 2018-04-23 ENCOUNTER — Ambulatory Visit: Payer: PPO | Admitting: Orthopedic Surgery

## 2018-04-23 DIAGNOSIS — IMO0001 Reserved for inherently not codable concepts without codable children: Secondary | ICD-10-CM

## 2018-04-23 NOTE — Telephone Encounter (Signed)
Put in order, called patient line busy. She will need contrast since she has had previous surgery, she will also need to have her creatinine checked prior to MRI scan.

## 2018-04-23 NOTE — Telephone Encounter (Signed)
Patient called, states very strongly that she cannot come to today's scheduled appointment - said "can't walk"; states wants Dr [name] to order an MRI. States she has already spoken with her insurer [employer], and with [facility]. States since Dr [name] does not do back surgery, she needs an MRI so that she can schedule with back specialist in [place]. I relayed that we can discuss at the appointment today, as MRI's need to be pre-authorized by insurer, if Dr [name] orders, however patient will be unable to come to today's appointment. She again said that she cannot walk and will not be able to come at all.  Her phone#[phone].

## 2018-04-23 NOTE — Telephone Encounter (Signed)
Order and see if its approved

## 2018-04-24 ENCOUNTER — Telehealth: Payer: Self-pay | Admitting: Radiology

## 2018-04-24 NOTE — Telephone Encounter (Signed)
I have advised her of the MRI and told her she can not have it until she has the lab work done, I have the order for lab work. Patient states she can not go for the labs she is in severe pain   She will let me know if she changes her mind, I told her without the labs she can not have the MRI scan  She is upset and crying on the phone, I did tell her multiple times no labs no MRI I hope she understood. Order for the labs is at our front desk.

## 2018-04-24 NOTE — Telephone Encounter (Signed)
Duplicate/error

## 2018-04-27 ENCOUNTER — Other Ambulatory Visit: Payer: Self-pay | Admitting: Orthopedic Surgery

## 2018-04-27 ENCOUNTER — Telehealth: Payer: Self-pay | Admitting: Orthopedic Surgery

## 2018-04-27 DIAGNOSIS — IMO0001 Reserved for inherently not codable concepts without codable children: Secondary | ICD-10-CM

## 2018-04-27 NOTE — Progress Notes (Signed)
Put in orders for Creat prior to MRI

## 2018-04-27 NOTE — Telephone Encounter (Signed)
Call received from EMS, Sherry DelaineJohn Coble, and also partner, Sherry Navarro, who relay that are on site at patient's home, as she has called [phone] to be transported to have blood work done in preparation for her scheduled MRI.  The EMS staff has advised patient that they do not transport to appointments such as lab/blood work or doctor's visits as patient called for.  They have not transported patient and advised her to call back to our office.. Patient did call back immediately afterward, and we again discussed transportation options.  Patient is trying to reach [facility] although wait time is 3 business days for transportation. If so, patient may be having labs done 05/02/18, and would have to have her MRI re-scheduled (which is presently 05/01/18, and labs are to be done prior).  Patient may need assistance with re-scheduling of MRI appointment.

## 2018-04-27 NOTE — Telephone Encounter (Signed)
She can call to [facility] the number is [phone]   I am not sure why she called EMS that is not an appropriate use of our emergency system.   I called patient to give [facility] the [facility]

## 2018-04-30 ENCOUNTER — Telehealth: Payer: Self-pay | Admitting: Orthopedic Surgery

## 2018-04-30 NOTE — Telephone Encounter (Signed)
Aundra MilletMegan called me back we discussed at length, and explained situation, she needed clarity due to multiple phone calls from patient.

## 2018-04-30 NOTE — Telephone Encounter (Signed)
FYI   After 2 other calls from the patient this morning, she called back just now to say, "she had rescheduled her appointment at [facility] to 05/09/18, and we can't schedule her MRI until she does have the blood work."

## 2018-04-30 NOTE — Telephone Encounter (Signed)
I can not order through home health. She is not considered homebound.

## 2018-04-30 NOTE — Telephone Encounter (Signed)
This is the fourth time in speaking with the patient this morning. She has called back again stating she spoke with her PCP's office and she was told that we could get Homehealth Care to come in and draw her blood work at her home.I told her I would get the message to the clinical staff.

## 2018-04-30 NOTE — Telephone Encounter (Signed)
Patient called stating she was in too much pain to go for the lab work she needs. I explained to her that she really needed to have the blood work done so she can get the MRI done that is scheduled. I told her that she had to have this stuff done so she can get some help, but she just kept saying " I can't make it, I am hurting so bad, I can't walk ".    Patient called back about 15 minutes after the above situation and was in a more calmer state and said she would need a wheelchair to go to the lab. I explained to her I didn't know about their office, but she could call them and find out if they have one or not. I gave her the number to the lab, she thank me and hung up. She was much calmer in her talking this time than before when she called.

## 2018-04-30 NOTE — Telephone Encounter (Signed)
This is the patient's PCP and they have been talking with the patient. They have questions and need some verification.  Please call and advise

## 2018-04-30 NOTE — Telephone Encounter (Signed)
Patient has called 4 times today regarding this. I have called patient to advise her we can not order home health to draw blood for her, this would be fraudulent, she is not home bound. I have also advised her to let us know after the MRI. She states she is not sure when she can go. I have told her it is up to her whether or not she goes. She has the number to [facility] for after the labwork. I hope this slows down the phone calls. I asked her to just let us know when she has the MRI .   To you FYI

## 2018-04-30 NOTE — Telephone Encounter (Signed)
I called Megan with Dr Karilyn CotaGosrani. Held for close to 10 minutes but she did not pick up phone.

## 2018-05-01 ENCOUNTER — Ambulatory Visit (HOSPITAL_COMMUNITY): Payer: PPO | Attending: Orthopedic Surgery

## 2018-05-09 ENCOUNTER — Other Ambulatory Visit: Payer: Self-pay

## 2018-05-09 ENCOUNTER — Telehealth: Payer: Self-pay | Admitting: Orthopedic Surgery

## 2018-05-09 NOTE — Telephone Encounter (Signed)
I tried to call the patient to let he know this but could not get an answer

## 2018-05-09 NOTE — Telephone Encounter (Signed)
Ms. Sherry Navarro called this afternoon stating that she went for bloodwork like we had told her and there was no order for her to have this done.   She said this was somewhere after 12:00 and that she tried to call our office.  I told her that we were closed for lunch from 12:00 to 1:30.  She wants a call letting her know when the order is sent for her to go have the blood work done.  Thanks

## 2018-05-09 NOTE — Patient Outreach (Signed)
[facility]) Care Management  05/09/2018  Sherry Navarro 11/10/1940 [id number]    TELEPHONE SCREENING Referral date:05/01/18 Referral source: primary MD referral Referral [facility].  Insurance: [employer] team advantage. Attempt #1  Telephone call to patient regarding primary MD referral. Unable to reach patient. HIPAA compliant voice message left with call back phone number.   PLAN: RNCM will attempt 2nd telephone call to patient within 4 business days. RNCM will send outreach letter.   [name] RN,BSN,CCM [facility] Telephonic  [phone]

## 2018-05-09 NOTE — Telephone Encounter (Signed)
Order is in system for [facility] and for Hospital there is a copy at our front desk I have advised her of this multiple times Where did she go for the blood work?

## 2018-05-10 ENCOUNTER — Other Ambulatory Visit: Payer: Self-pay | Admitting: Radiology

## 2018-05-10 DIAGNOSIS — IMO0001 Reserved for inherently not codable concepts without codable children: Secondary | ICD-10-CM

## 2018-05-15 ENCOUNTER — Other Ambulatory Visit: Payer: Self-pay

## 2018-05-15 NOTE — Patient Outreach (Signed)
[facility]) Care Management  05/15/2018  Marti Sleighancy C Bendavid 09/18/1941 [id number]   TELEPHONE SCREENING Referral date:05/01/18 Referral source: primary MD referral Referral reason: [facility].  Insurance: Health team advantage. Attempt #2  Telephone call to patient regarding primary MD referral. Contact answering phone states patient is not available now and will be gone the rest of the day. Request return call. HIPAA compliant message left.  Contact would not take call back phone number.   PLAN: RNCM will attempt 3rd telephone call to patient within 4 business days.   George InaDavina Donevin Sainsbury RN,BSN,CCM [facility] Telephonic  [phone]

## 2018-05-18 ENCOUNTER — Other Ambulatory Visit: Payer: Self-pay

## 2018-05-18 NOTE — Patient Outreach (Signed)
[facility]) Care Management  05/18/2018  Marti Sleighancy C Mell 04/25/1941 [id number]  TELEPHONE SCREENING Referral date:05/01/18 Referral source:primary MD referral Referral reason:[facility] assess for needs. Insurance:Health team advantage. Attempt #3  Telephone call to patient regarding primary MD referral. Contact answering phone states patient is not available now States patient is still in the bed.  Contact would not take call back phone number. Requested call back.   PLAN: If no return call will proceed with case closure.   George InaDavina Caydee Talkington RN,BSN,CCM [facility] Telephonic  [phone]

## 2018-05-22 ENCOUNTER — Other Ambulatory Visit: Payer: Self-pay

## 2018-05-22 NOTE — Patient Outreach (Signed)
[facility]) Care Management  05/22/2018  Sherry Navarro 04/06/1941 [id number]   TELEPHONE SCREENING Referral date:05/01/18 Referral source:primary MD referral Referral reason:[facility] assess for needs. Insurance:Health team advantage.   Telephone call to patient regarding primary MD referral. Unable to reach patient.  No return call from patient after 3 telephone calls and outreach letter attempt   PLAN: RNCM will close patient due to being unable to reach.  RNCM will send patients primary care provider closure notification  George InaDavina Landynn Dupler RN,BSN,CCM [facility] Telephonic  [phone]

## 2018-06-18 DIAGNOSIS — H6120 Impacted cerumen, unspecified ear: Secondary | ICD-10-CM | POA: Diagnosis not present

## 2018-06-25 ENCOUNTER — Ambulatory Visit (INDEPENDENT_AMBULATORY_CARE_PROVIDER_SITE_OTHER): Payer: PPO | Admitting: Otolaryngology

## 2018-06-25 DIAGNOSIS — H6123 Impacted cerumen, bilateral: Secondary | ICD-10-CM | POA: Diagnosis not present

## 2018-06-25 DIAGNOSIS — H900 Conductive hearing loss, bilateral: Secondary | ICD-10-CM

## 2018-07-04 DIAGNOSIS — M2042 Other hammer toe(s) (acquired), left foot: Secondary | ICD-10-CM | POA: Diagnosis not present

## 2018-07-04 DIAGNOSIS — M2012 Hallux valgus (acquired), left foot: Secondary | ICD-10-CM | POA: Diagnosis not present

## 2018-07-04 DIAGNOSIS — L851 Acquired keratosis [keratoderma] palmaris et plantaris: Secondary | ICD-10-CM | POA: Diagnosis not present

## 2018-07-04 DIAGNOSIS — M25872 Other specified joint disorders, left ankle and foot: Secondary | ICD-10-CM | POA: Diagnosis not present

## 2018-07-09 ENCOUNTER — Ambulatory Visit (INDEPENDENT_AMBULATORY_CARE_PROVIDER_SITE_OTHER): Payer: PPO | Admitting: Otolaryngology

## 2018-07-09 DIAGNOSIS — H7313 Chronic myringitis, bilateral: Secondary | ICD-10-CM

## 2018-07-09 DIAGNOSIS — H6123 Impacted cerumen, bilateral: Secondary | ICD-10-CM

## 2018-07-30 DIAGNOSIS — M2042 Other hammer toe(s) (acquired), left foot: Secondary | ICD-10-CM | POA: Diagnosis not present

## 2018-07-30 DIAGNOSIS — M25872 Other specified joint disorders, left ankle and foot: Secondary | ICD-10-CM | POA: Diagnosis not present

## 2018-07-30 DIAGNOSIS — L851 Acquired keratosis [keratoderma] palmaris et plantaris: Secondary | ICD-10-CM | POA: Diagnosis not present

## 2018-07-30 DIAGNOSIS — M2012 Hallux valgus (acquired), left foot: Secondary | ICD-10-CM | POA: Diagnosis not present

## 2018-08-27 DIAGNOSIS — M2042 Other hammer toe(s) (acquired), left foot: Secondary | ICD-10-CM | POA: Diagnosis not present

## 2018-08-27 DIAGNOSIS — L851 Acquired keratosis [keratoderma] palmaris et plantaris: Secondary | ICD-10-CM | POA: Diagnosis not present

## 2018-08-27 DIAGNOSIS — M25872 Other specified joint disorders, left ankle and foot: Secondary | ICD-10-CM | POA: Diagnosis not present

## 2018-08-27 DIAGNOSIS — M2012 Hallux valgus (acquired), left foot: Secondary | ICD-10-CM | POA: Diagnosis not present

## 2018-10-01 ENCOUNTER — Ambulatory Visit (HOSPITAL_COMMUNITY): Payer: PPO | Admitting: Psychiatry

## 2018-10-01 ENCOUNTER — Encounter (HOSPITAL_COMMUNITY): Payer: Self-pay | Admitting: Psychiatry

## 2018-10-01 DIAGNOSIS — F202 Catatonic schizophrenia: Secondary | ICD-10-CM | POA: Diagnosis not present

## 2018-10-01 MED ORDER — PERPHENAZINE 8 MG PO TABS: 16.0000 mg | ORAL_TABLET | Freq: Every day | ORAL | 2 refills | Status: DC

## 2018-10-01 NOTE — Progress Notes (Signed)
BH MD/PA/NP OP Progress Note  10/01/2018 1:53 PM Sherry Navarro  MRN:  [id number]  Chief Complaint:  Chief Complaint    Schizophrenia; Follow-up     HPI: This patient is a 77 year old widowed white female who lives with her sister in [place]. She used to work as a Tree surgeon  The patient states that she has had mental illness since her early 84s. She was not married to a man was running around on her and she had a breakdown and was diagnosed with schizophrenia. We don't have any documentation of this. She was on Navane for years but it was no longer being manufactured to several months ago she was put on perphenazine 16 mg per day. She has terrible shaking in her hands and some in her feet which is probably secondary to these neuroleptic medications. She claims she had this shaking even before she was on medicine but that it's difficult to believe. She claims that she tries to get off these medicines she gets terrible headaches but I think we need to try given her shakiness. She claims she's tried Inderal Cogentin and benzodiazepines but nothing has helped.  Currently she has no paranoia auditory or visual hallucinations depression or suicidal ideation. She her sister go shopping every day and ought to be and she seems happy with her current lifestyle  The patient returns after 6 months.  She is very nicely dressed with still has shaking tremors in both hands and her arm and she states her body feels as if it shaking as well.  I have tried to convince her to try some of the medicines that are out now for tardive dyskinesia but she refuses.  I do not know if there is some element of parkinsonism with the pill-rolling tremor and I would like her to see a neurologist.  She is seeing her primary doctor, Dr. Karilyn Cota tomorrow and I suggest that she has for referral but she refuses.  She claims that her sister can get her there.  She does not want any more medications or treatment.  Also refuses to take  anything other than perphenazine despite the fact that is probably caused her shakes.  She denies auditory visual hallucinations or paranoia right now. Visit Diagnosis:    ICD-10-CM   1. 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 AC (acromioclavicular) joint bone spurs   . Hiatal hernia   . High cholesterol   . Osteoarthritis   . Schizophrenia (HCC)   . Seizures (HCC)   . Thyroid disorder     Past Surgical History:  Procedure Laterality Date  . back for spinal stenonsis not by Dr. Romeo Apple    . CHOLECYSTECTOMY    . rt knee replacement not by Dr. Romeo Apple      Family Psychiatric History: See below  Family History:  Family History  Problem Relation Age of Onset  . Depression Other   . Schizophrenia Neg Hx   . ADD / ADHD Neg Hx   . Alcohol abuse Neg Hx   . Drug abuse Neg Hx   . Anxiety disorder Neg Hx   . Bipolar disorder Neg Hx   . Dementia Neg Hx   . OCD Neg Hx   . Paranoid behavior Neg Hx   . Seizures Neg Hx   .  Sexual abuse Neg Hx   . Physical abuse Neg Hx     Social History:  Social History   Socioeconomic History  . Marital status: Widowed    Spouse name: Not on file  . Number of children: Not on file  . Years of education: Not on file  . Highest education level: Not on file  Occupational History  . Not on file  Social Needs  . Financial resource strain: Not on file  . Food insecurity:    Worry: Not on file    Inability: Not on file  . Transportation needs:    Medical: Not on file    Non-medical: Not on file  Tobacco Use  . Smoking status: Former Games developer  . Smokeless tobacco: Never Used  Substance and Sexual Activity  . Alcohol use: No  . Drug use: No  . Sexual activity: Never  Lifestyle  . Physical activity:    Days  per week: Not on file    Minutes per session: Not on file  . Stress: Not on file  Relationships  . Social connections:    Talks on phone: Not on file    Gets together: Not on file    Attends religious service: Not on file    Active member of club or organization: Not on file    Attends meetings of clubs or organizations: Not on file    Relationship status: Not on file  Other Topics Concern  . Not on file  Social History Narrative  . Not on file    Allergies:  Allergies  Allergen Reactions  . Thorazine [Chlorpromazine] Other (See Comments)    seizures    Metabolic Disorder Labs: Lab Results  Component Value Date   HGBA1C 5.5 04/03/2012   MPG 111 04/03/2012   No results found for: PROLACTIN No results found for: CHOL, TRIG, HDL, CHOLHDL, VLDL, LDLCALC No results found for: TSH  Therapeutic Level Labs: No results found for: LITHIUM No results found for: VALPROATE No components found for:  CBMZ  Current Medications: Current Outpatient Medications  Medication Sig Dispense Refill  . Cimetidine (TAGAMET PO) Take 300 mg by mouth at bedtime.     Marland Kitchen HYDROcodone-acetaminophen (NORCO/VICODIN) 5-325 MG tablet Take 1 tablet by mouth every 6 (six) hours as needed. 6 tablet 0  . HYDROcodone-acetaminophen (NORCO/VICODIN) 5-325 MG tablet Take 1 tablet by mouth every 6 (six) hours as needed. 12 tablet 0  . levothyroxine (SYNTHROID, LEVOTHROID) 25 MCG tablet Take 25 mcg by mouth daily before breakfast.    . Multiple Vitamins-Minerals (ONE-A-DAY WOMENS 50 PLUS PO) Take 1 tablet by mouth daily.    Marland Kitchen NABUMETONE PO Take 750 mg by mouth daily. Reported on 12/15/2015    . perphenazine (TRILAFON) 8 MG tablet Take 2 tablets (16 mg total) by mouth at bedtime. 180 tablet 2  . predniSONE (STERAPRED UNI-PAK 48 TAB) 10 MG (48) TBPK tablet Take by mouth daily. 10 mg double strength Dosepak 12 days 48 tablet 1  . traMADol (ULTRAM) 50 MG tablet Take 50 mg by mouth every 6 (six) hours as needed.     No  current facility-administered medications for this visit.      Musculoskeletal: Strength & Muscle Tone: decreased Gait & Station: unsteady Patient leans: N/A  Psychiatric Specialty Exam: Review of Systems  Musculoskeletal: Positive for back pain and joint pain.  Neurological: Positive for tremors.  Psychiatric/Behavioral: The patient is nervous/anxious.   All other systems reviewed and are negative.   Blood pressure Marland Kitchen)  154/84, pulse 90, height 5\' 7"  (1.702 m), weight 219 lb (99.3 kg), SpO2 95 %.Body mass index is 34.3 kg/m.  General Appearance: Casual, Neat and Well Groomed  Eye Contact:  Good  Speech:  Clear and Coherent  Volume:  Decreased  Mood:  Anxious and Dysphoric  Affect:  Congruent  Thought Process:  Goal Directed  Orientation:  Full (Time, Place, and Person)  Thought Content: Rumination   Suicidal Thoughts:  No  Homicidal Thoughts:  No  Memory:  Immediate;   Good Recent;   Fair Remote;   Fair  Judgement:  Poor  Insight:  Lacking  Psychomotor Activity:  Tremor  Concentration:  Concentration: Fair and Attention Span: Fair  Recall:  FiservFair  Fund of Knowledge: Fair  Language: Good  Akathisia:  No  Handed:  Right  AIMS (if indicated): Aims score is 23  Assets:  Communication Skills Resilience Social Support  ADL's:  Intact  Cognition: WNL  Sleep:  Fair   Screenings: PHQ2-9     Patient Outreach Telephone from 04/19/2018 in [facility]  PHQ-2 Total Score  4  PHQ-9 Total Score  9       Assessment and Plan: This patient is a 77 year old female with a long-term history of schizophrenia.  She is very resistant to treatment.  She refuses to see a neurologist or try any medications for tardive dyskinesia.  I am not sure what else can really be done.  For now she will continue perphenazine 16 mg daily at bedtime.  She will return to see me in 6 months.  I will try to talk to her primary doctor about a neurology referral   Diannia Rudereborah Joe Gee,  MD 10/01/2018, 1:53 PM

## 2018-10-03 DIAGNOSIS — E039 Hypothyroidism, unspecified: Secondary | ICD-10-CM | POA: Diagnosis not present

## 2018-10-03 DIAGNOSIS — I10 Essential (primary) hypertension: Secondary | ICD-10-CM | POA: Diagnosis not present

## 2018-10-03 DIAGNOSIS — R5383 Other fatigue: Secondary | ICD-10-CM | POA: Diagnosis not present

## 2018-10-03 DIAGNOSIS — M519 Unspecified thoracic, thoracolumbar and lumbosacral intervertebral disc disorder: Secondary | ICD-10-CM | POA: Diagnosis not present

## 2018-10-03 DIAGNOSIS — K219 Gastro-esophageal reflux disease without esophagitis: Secondary | ICD-10-CM | POA: Diagnosis not present

## 2018-10-15 DIAGNOSIS — M25872 Other specified joint disorders, left ankle and foot: Secondary | ICD-10-CM | POA: Diagnosis not present

## 2018-10-15 DIAGNOSIS — M79675 Pain in left toe(s): Secondary | ICD-10-CM | POA: Diagnosis not present

## 2018-10-15 DIAGNOSIS — M2012 Hallux valgus (acquired), left foot: Secondary | ICD-10-CM | POA: Diagnosis not present

## 2018-10-15 DIAGNOSIS — L851 Acquired keratosis [keratoderma] palmaris et plantaris: Secondary | ICD-10-CM | POA: Diagnosis not present

## 2018-11-19 DIAGNOSIS — M25872 Other specified joint disorders, left ankle and foot: Secondary | ICD-10-CM | POA: Diagnosis not present

## 2018-11-19 DIAGNOSIS — L851 Acquired keratosis [keratoderma] palmaris et plantaris: Secondary | ICD-10-CM | POA: Diagnosis not present

## 2018-11-19 DIAGNOSIS — M79675 Pain in left toe(s): Secondary | ICD-10-CM | POA: Diagnosis not present

## 2018-11-19 DIAGNOSIS — M2012 Hallux valgus (acquired), left foot: Secondary | ICD-10-CM | POA: Diagnosis not present

## 2018-12-17 DIAGNOSIS — M25872 Other specified joint disorders, left ankle and foot: Secondary | ICD-10-CM | POA: Diagnosis not present

## 2018-12-17 DIAGNOSIS — M2012 Hallux valgus (acquired), left foot: Secondary | ICD-10-CM | POA: Diagnosis not present

## 2018-12-17 DIAGNOSIS — M79675 Pain in left toe(s): Secondary | ICD-10-CM | POA: Diagnosis not present

## 2018-12-17 DIAGNOSIS — L851 Acquired keratosis [keratoderma] palmaris et plantaris: Secondary | ICD-10-CM | POA: Diagnosis not present

## 2018-12-24 DIAGNOSIS — R05 Cough: Secondary | ICD-10-CM | POA: Diagnosis not present

## 2019-01-07 ENCOUNTER — Ambulatory Visit (INDEPENDENT_AMBULATORY_CARE_PROVIDER_SITE_OTHER): Payer: Self-pay | Admitting: Otolaryngology

## 2019-01-09 ENCOUNTER — Encounter (INDEPENDENT_AMBULATORY_CARE_PROVIDER_SITE_OTHER): Payer: Self-pay | Admitting: Internal Medicine

## 2019-01-11 ENCOUNTER — Encounter (INDEPENDENT_AMBULATORY_CARE_PROVIDER_SITE_OTHER): Payer: Self-pay | Admitting: Internal Medicine

## 2019-01-14 DIAGNOSIS — M79675 Pain in left toe(s): Secondary | ICD-10-CM | POA: Diagnosis not present

## 2019-01-14 DIAGNOSIS — M2012 Hallux valgus (acquired), left foot: Secondary | ICD-10-CM | POA: Diagnosis not present

## 2019-01-14 DIAGNOSIS — L851 Acquired keratosis [keratoderma] palmaris et plantaris: Secondary | ICD-10-CM | POA: Diagnosis not present

## 2019-01-14 DIAGNOSIS — M25872 Other specified joint disorders, left ankle and foot: Secondary | ICD-10-CM | POA: Diagnosis not present

## 2019-01-16 DIAGNOSIS — M159 Polyosteoarthritis, unspecified: Secondary | ICD-10-CM | POA: Diagnosis not present

## 2019-02-12 DIAGNOSIS — E039 Hypothyroidism, unspecified: Secondary | ICD-10-CM | POA: Diagnosis not present

## 2019-02-12 DIAGNOSIS — K5900 Constipation, unspecified: Secondary | ICD-10-CM | POA: Diagnosis not present

## 2019-02-12 DIAGNOSIS — K219 Gastro-esophageal reflux disease without esophagitis: Secondary | ICD-10-CM | POA: Diagnosis not present

## 2019-02-12 DIAGNOSIS — M159 Polyosteoarthritis, unspecified: Secondary | ICD-10-CM | POA: Diagnosis not present

## 2019-03-04 DIAGNOSIS — M2012 Hallux valgus (acquired), left foot: Secondary | ICD-10-CM | POA: Diagnosis not present

## 2019-03-04 DIAGNOSIS — M79675 Pain in left toe(s): Secondary | ICD-10-CM | POA: Diagnosis not present

## 2019-03-04 DIAGNOSIS — M25872 Other specified joint disorders, left ankle and foot: Secondary | ICD-10-CM | POA: Diagnosis not present

## 2019-03-04 DIAGNOSIS — L851 Acquired keratosis [keratoderma] palmaris et plantaris: Secondary | ICD-10-CM | POA: Diagnosis not present

## 2019-03-26 DIAGNOSIS — E785 Hyperlipidemia, unspecified: Secondary | ICD-10-CM | POA: Diagnosis not present

## 2019-03-26 DIAGNOSIS — E559 Vitamin D deficiency, unspecified: Secondary | ICD-10-CM | POA: Diagnosis not present

## 2019-03-26 DIAGNOSIS — I10 Essential (primary) hypertension: Secondary | ICD-10-CM | POA: Diagnosis not present

## 2019-03-26 DIAGNOSIS — E039 Hypothyroidism, unspecified: Secondary | ICD-10-CM | POA: Diagnosis not present

## 2019-03-26 DIAGNOSIS — R5383 Other fatigue: Secondary | ICD-10-CM | POA: Diagnosis not present

## 2019-04-02 ENCOUNTER — Ambulatory Visit (INDEPENDENT_AMBULATORY_CARE_PROVIDER_SITE_OTHER): Payer: PPO | Admitting: Psychiatry

## 2019-04-02 ENCOUNTER — Other Ambulatory Visit: Payer: Self-pay

## 2019-04-02 ENCOUNTER — Encounter (HOSPITAL_COMMUNITY): Payer: Self-pay | Admitting: Psychiatry

## 2019-04-02 DIAGNOSIS — F202 Catatonic schizophrenia: Secondary | ICD-10-CM

## 2019-04-02 MED ORDER — PERPHENAZINE 8 MG PO TABS: 16.0000 mg | ORAL_TABLET | Freq: Every day | ORAL | 2 refills | Status: DC

## 2019-04-02 NOTE — Progress Notes (Signed)
Virtual Visit via Telephone Note  I connected with Sherry Navarro on 04/02/19 at  1:00 PM EDT by telephone and verified that I am speaking with the correct person using two identifiers.   I discussed the limitations, risks, security and privacy concerns of performing an evaluation and management service by telephone and the availability of in person appointments. I also discussed with the patient that there may be a patient responsible charge related to this service. The patient expressed understanding and agreed to proceed.     I discussed the assessment and treatment plan with the patient. The patient was provided an opportunity to ask questions and all were answered. The patient agreed with the plan and demonstrated an understanding of the instructions.   The patient was advised to call back or seek an in-person evaluation if the symptoms worsen or if the condition fails to improve as anticipated.  I provided 15 minutes of non-face-to-face time during this encounter.   Sherry Navarro , MD  [facility] OP Progress Note  04/02/2019 1:21 PM Sherry Sleighancy C Asper  MRN:  [id number]  Chief Complaint:  Chief Complaint    Schizophrenia; Follow-up     XLK:GMWNHPI:This patient is a 78 year old widowed white female who lives with her sister in [place]. She used to work as a Tree surgeonbeautician  The patient states that she has had mental illness since her early 3020s. She was not married to a man was running around on her and she had a breakdown and was diagnosed with schizophrenia. We don't have any documentation of this. She was on Navane for years but it was no longer being manufactured to several months ago she was put on perphenazine 16 mg per day. She has terrible shaking in her hands and some in her feet which is probably secondary to these neuroleptic medications. She claims she had this shaking even before she was on medicine but that it's difficult to believe. She claims that she tries to get off these medicines  she gets terrible headaches but I think we need to try given her shakiness. She claims she's tried Inderal Cogentin and benzodiazepines but nothing has helped.  Currently she has no paranoia auditory or visual hallucinations depression or suicidal ideation. She her sister go shopping every day and ought to be and she seems happy with her current lifestyle  The patient returns after 6 months.  She is assessed via phone due to the coronavirus pandemic.  She states that she and her sister been staying in most of the time.  She claims that people "for my past" have been calling and hanging up and also getting their engines outside her house.  She states she has had the police come out several times and claims all these people was arrested.  She also states that these people appear to her "in another dimension" and her asked her.  It is hard to know which of these statements are actually true or imagined.  She denies any auditory hallucinations or visual hallucinations or command hallucinations telling her to harm herself or others.  She does seem more focused on people trying to harass her.  The patient seems anxious and is having trouble sleeping however she declined my offer of medication to help with sleep or anxiety.  She is declined my offers in the past to help with medicines for tardive dyskinesia or shakes she is adamant about just staying on the perphenazine even though I explained that this is caused her tremulousness.  She  does not want to try anything else.  She states that she and her sister are getting along fairly well.  They are still does not work and they are eating sandwiches.  Fortunately Department of Social Services has been checking on them periodically Visit Diagnosis:    ICD-10-CM   1. 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 AC (acromioclavicular) joint bone spurs   . Hiatal hernia   . High cholesterol   . Osteoarthritis   . Schizophrenia (HCC)   . Seizures (HCC)   . Thyroid disorder     Past Surgical History:  Procedure Laterality Date  . back for spinal stenonsis not by Dr. Romeo Apple    . CHOLECYSTECTOMY    . rt knee replacement not by Dr. Romeo Apple      Family Psychiatric History: See below  Family History:  Family History  Problem Relation Age of Onset  . Depression Other   . Schizophrenia Neg Hx   . ADD / ADHD Neg Hx   . Alcohol abuse Neg Hx   . Drug abuse Neg Hx   . Anxiety disorder Neg Hx   . Bipolar disorder Neg Hx   . Dementia Neg Hx   . OCD Neg Hx   . Paranoid behavior Neg Hx   . Seizures Neg Hx   . Sexual abuse Neg Hx   . Physical abuse Neg Hx     Social History:  Social History   Socioeconomic History  . Marital status: Widowed    Spouse name: Not on file  . Number of children: Not on file  . Years of education: Not on file  . Highest education level: Not on file  Occupational History  . Not on file  Social Needs  . Financial resource strain: Not on file  . Food insecurity:    Worry: Not on file    Inability: Not on file  . Transportation needs:    Medical: Not on file    Non-medical: Not on file  Tobacco Use  . Smoking status: Former Games developer  . Smokeless tobacco: Never Used  Substance and Sexual Activity  . Alcohol use: No  . Drug use: No  . Sexual activity: Never  Lifestyle  . Physical activity:    Days per week: Not on file    Minutes per session: Not on file  . Stress: Not on file  Relationships  . Social connections:    Talks on phone: Not on file    Gets together: Not on file    Attends religious service: Not on file    Active member of club or organization: Not on file    Attends meetings of clubs or organizations: Not on file    Relationship status: Not on file   Other Topics Concern  . Not on file  Social History Narrative  . Not on file    Allergies:  Allergies  Allergen Reactions  . Chlorpromazine Other (See Comments)    seizures unknown    Metabolic Disorder Labs: Lab Results  Component Value Date   HGBA1C 5.5 04/03/2012   MPG 111 04/03/2012   No results found for: PROLACTIN No results found for: CHOL, TRIG, HDL, CHOLHDL, VLDL, LDLCALC No results found for: TSH  Therapeutic Level Labs: No results found for: LITHIUM No results  found for: VALPROATE No components found for:  CBMZ  Current Medications: Current Outpatient Medications  Medication Sig Dispense Refill  . amLODipine (NORVASC) 5 MG tablet     . azithromycin (ZITHROMAX) 250 MG tablet     . Cimetidine (TAGAMET PO) Take 300 mg by mouth at bedtime.     Marland Kitchen HYDROcodone-acetaminophen (NORCO/VICODIN) 5-325 MG tablet Take 1 tablet by mouth every 6 (six) hours as needed. 6 tablet 0  . HYDROcodone-acetaminophen (NORCO/VICODIN) 5-325 MG tablet Take 1 tablet by mouth every 6 (six) hours as needed. 12 tablet 0  . levothyroxine (SYNTHROID, LEVOTHROID) 25 MCG tablet Take 25 mcg by mouth daily before breakfast.    . Multiple Vitamins-Minerals (ONE-A-DAY WOMENS 50 PLUS PO) Take 1 tablet by mouth daily.    Marland Kitchen NABUMETONE PO Take 750 mg by mouth daily. Reported on 12/15/2015    . pantoprazole (PROTONIX) 40 MG tablet     . perphenazine (TRILAFON) 8 MG tablet Take 2 tablets (16 mg total) by mouth at bedtime. 180 tablet 2  . predniSONE (DELTASONE) 20 MG tablet     . predniSONE (STERAPRED UNI-PAK 48 TAB) 10 MG (48) TBPK tablet Take by mouth daily. 10 mg double strength Dosepak 12 days 48 tablet 1  . PROAIR HFA 108 (90 Base) MCG/ACT inhaler     . SYNTHROID 150 MCG tablet     . traMADol (ULTRAM) 50 MG tablet Take 50 mg by mouth every 6 (six) hours as needed.     No current facility-administered medications for this visit.      Musculoskeletal: Strength & Muscle Tone: decreased Gait &  Station: normal Patient leans: N/A  Psychiatric Specialty Exam: Review of Systems  Musculoskeletal: Positive for back pain and joint pain.  Psychiatric/Behavioral: The patient has insomnia.   All other systems reviewed and are negative.   There were no vitals taken for this visit.There is no height or weight on file to calculate BMI.  General Appearance: NA  Eye Contact:  NA  Speech:  Clear and Coherent  Volume:  Decreased  Mood:  Anxious  Affect:  NA  Thought Process:  Goal Directed  Orientation:  Full (Time, Place, and Person)  Thought Content: Delusions and Paranoid Ideation   Suicidal Thoughts:  No  Homicidal Thoughts:  No  Memory:  Immediate;   Good Recent;   Fair Remote;   Poor  Judgement:  Poor  Insight:  Lacking  Psychomotor Activity:  Tremor  Concentration:  Concentration: Fair and Attention Span: Fair  Recall:  F[employer] of Knowledge: Fair  Language: Good  Akathisia:  No  Handed:  Right  AIMS (if indicated): Unable to perform due to phone visit but patient continues to report tremulousness in her trunk legs and hands  Assets:  Communication Skills Desire for Improvement Resilience Social Support Talents/Skills  ADL's:  Intact  Cognition: WNL  Sleep:  Poor   Screenings: AIMS     Office Visit from 10/01/2018 in [facility]  AIMS Total Score  23    PHQ2-9     Patient Outreach Telephone from 04/19/2018 in [facility]  PHQ-2 Total Score  4  PHQ-9 Total Score  9       Assessment and Plan: This patient is a 78 year old female with a history of schizophrenia.  She still seems to harbor delusions now they are about people from her past coming back to harass her.  She never wants to make any changes in her medications and  this leaves [place] rather helpless in our ability to do anything for her.  She will continue with perphenazine 16 mg at bedtime.  She refuses medication for anxiety sleep or tardive  dyskinesia.  At her request she will return to see me at 6 months   Sherry Ruder, MD 04/02/2019, 1:21 PM

## 2019-06-26 DIAGNOSIS — E039 Hypothyroidism, unspecified: Secondary | ICD-10-CM | POA: Diagnosis not present

## 2019-06-26 DIAGNOSIS — I10 Essential (primary) hypertension: Secondary | ICD-10-CM | POA: Diagnosis not present

## 2019-06-26 DIAGNOSIS — E59 Dietary selenium deficiency: Secondary | ICD-10-CM | POA: Diagnosis not present

## 2019-06-26 DIAGNOSIS — M159 Polyosteoarthritis, unspecified: Secondary | ICD-10-CM | POA: Diagnosis not present

## 2019-07-13 ENCOUNTER — Other Ambulatory Visit (INDEPENDENT_AMBULATORY_CARE_PROVIDER_SITE_OTHER): Payer: Self-pay | Admitting: Internal Medicine

## 2019-08-12 ENCOUNTER — Other Ambulatory Visit (INDEPENDENT_AMBULATORY_CARE_PROVIDER_SITE_OTHER): Payer: Self-pay | Admitting: Internal Medicine

## 2019-08-22 ENCOUNTER — Other Ambulatory Visit (INDEPENDENT_AMBULATORY_CARE_PROVIDER_SITE_OTHER): Payer: Self-pay | Admitting: Internal Medicine

## 2019-08-26 ENCOUNTER — Telehealth (INDEPENDENT_AMBULATORY_CARE_PROVIDER_SITE_OTHER): Payer: Self-pay | Admitting: *Deleted

## 2019-08-26 NOTE — Telephone Encounter (Signed)
Bridgette Yoho with [facility] Dept called wanting to know if she could get an FL2 filled out for pt. They are trying to get placement and wanted to see what was recommended also as far as skilled nursing facility or assisted nursing facility. This FL2 can be faxed to [phone] and bridgette can be reached at [phone]

## 2019-08-26 NOTE — Telephone Encounter (Signed)
Can you please look into this and see what we need to do? Thanks.

## 2019-08-26 NOTE — Telephone Encounter (Signed)
Fax FL2 form to her back. They found a place in a snf who will take her.

## 2019-09-05 ENCOUNTER — Other Ambulatory Visit: Payer: Self-pay | Admitting: *Deleted

## 2019-09-05 NOTE — Patient Outreach (Signed)
[facility]) Care Management  09/05/2019  Sherry Navarro [birth year] [id number]   CSW received urgent referral from [facility] UM, Janey Genta that patient is a 78 year old widowed white female who lives alone. Home is significantly infested with roaches & rodents. Member reports having to pick roaches out of her food to eat. [place] APS is working with member on placement. Member does not qualify for ALF assistance or Medicaid at this time. 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 Member shakes so badly she is not able to prepare her own food or care for herself (bathing & dressing). She does have unsteady gait. Sits most of the time as she has fear of falling and has fallen approx. 2 times that APS is aware of.   FL2 is done, 3 SNFs have refused member outright, APS worker awaiting on calls back from about 5 other, 3 others contacted has not responded. In addition to the [facility] dx and the inability to find a SNF, additional difficulty related to covid and need for covid testing and a SNF willing to take patient from home with significant BH dx. Member has delusions. Patient has a past criminal background related to her schizophrenia - when she was released from prison she came to live with sister in [place]. Lived with sister for approx. 77 years, sister recently went to live with her son due to dementia.   Has PMH of Schizophrenia and sees Cloria Spring, MD- Psychiatrist approximately Q 6 months. Last seen via tele visit 04-26-2019. Other dx include hiatal hernia, HLD, OA, seizures, thyroid disorder, spinal stenosis, right knee replacement, cholecystectomy, acromioclavicular joint bone spurs, anxiety, insomnia, tardive dyskinesia.   PCP office of [facility] assisted in getting FL2 done and if member is accected by a SNF needs a level 2 PAASR to be able to admit to SNF.  CSW attempted to reach APS worker, Betsey Amen but had  to leave a voicemail (ph#: [phone], ext [phone]). CSW will await returned call from [facility] to try to collaborate & offer assistance with placement. Level II PASSAR usually takes a couple of weeks to obtain if patient is expected to remain in the SNF for longer than 30 days.    Raynaldo Opitz, LCSW [facility]  Clinical Social Worker cell #: [phone]

## 2019-09-09 ENCOUNTER — Telehealth (HOSPITAL_COMMUNITY): Payer: Self-pay | Admitting: Psychiatry

## 2019-09-09 ENCOUNTER — Ambulatory Visit: Payer: Self-pay | Admitting: *Deleted

## 2019-09-09 NOTE — Telephone Encounter (Signed)
[facility] SW [place] advised concern of pt wanting to harm self. SW called to advise.

## 2019-09-12 ENCOUNTER — Ambulatory Visit (INDEPENDENT_AMBULATORY_CARE_PROVIDER_SITE_OTHER): Payer: PPO | Admitting: Internal Medicine

## 2019-09-12 ENCOUNTER — Ambulatory Visit (HOSPITAL_COMMUNITY): Payer: PPO | Admitting: Psychiatry

## 2019-09-12 ENCOUNTER — Ambulatory Visit (INDEPENDENT_AMBULATORY_CARE_PROVIDER_SITE_OTHER): Payer: PPO | Admitting: Psychiatry

## 2019-09-12 ENCOUNTER — Encounter (HOSPITAL_COMMUNITY): Payer: Self-pay | Admitting: Psychiatry

## 2019-09-12 ENCOUNTER — Other Ambulatory Visit: Payer: Self-pay

## 2019-09-12 ENCOUNTER — Encounter (INDEPENDENT_AMBULATORY_CARE_PROVIDER_SITE_OTHER): Payer: Self-pay | Admitting: Internal Medicine

## 2019-09-12 VITALS — BP 130/70 | HR 77 | Temp 98.3°F

## 2019-09-12 DIAGNOSIS — N898 Other specified noninflammatory disorders of vagina: Secondary | ICD-10-CM | POA: Diagnosis not present

## 2019-09-12 DIAGNOSIS — F202 Catatonic schizophrenia: Secondary | ICD-10-CM | POA: Diagnosis not present

## 2019-09-12 MED ORDER — FLUCONAZOLE 150 MG PO TABS: 150.0000 mg | ORAL_TABLET | Freq: Every day | ORAL | 0 refills | Status: DC

## 2019-09-12 MED ORDER — PERPHENAZINE 8 MG PO TABS: 16.0000 mg | ORAL_TABLET | Freq: Every day | ORAL | 2 refills | Status: DC

## 2019-09-12 MED ORDER — CLOTRIMAZOLE-BETAMETHASONE 1-0.05 % EX CREA: 1.0000 "application " | TOPICAL_CREAM | Freq: Two times a day (BID) | CUTANEOUS | 0 refills | Status: DC

## 2019-09-12 NOTE — Progress Notes (Signed)
Metrics: Intervention Frequency ACO  Documented Smoking Status Yearly  Screened one or more times in 24 months  Cessation Counseling or  Active cessation medication Past 24 months  Past 24 months   Guideline developer: UpToDate (See UpToDate for funding source) Date Released: 2014       Wellness Office Visit  Subjective:  Patient ID: Sherry Navarro, female    DOB: [birth year]  Age: 78 y.o. MRN: [id number]  CC: Vagina discharge HPI  This lady comes in as acute visit because she has had vaginal discharge and also erythema in the genital area.  She denies any fever. Past Medical History:  Diagnosis Date  . AC (acromioclavicular) joint bone spurs   . Hiatal hernia   . High cholesterol   . Osteoarthritis   . Schizophrenia ([place])   . Seizures ([place])   . Thyroid disorder       Family History  Problem Relation Age of Onset  . Depression Other   . Schizophrenia Neg Hx   . ADD / ADHD Neg Hx   . Alcohol abuse Neg Hx   . Drug abuse Neg Hx   . Anxiety disorder Neg Hx   . Bipolar disorder Neg Hx   . Dementia Neg Hx   . OCD Neg Hx   . Paranoid behavior Neg Hx   . Seizures Neg Hx   . Sexual abuse Neg Hx   . Physical abuse Neg Hx     Social History   Social History Narrative   Widower since 2010.Married for 37 years.Lives with 40 year old sister.Retired beautitician.   Social History   Tobacco Use  . Smoking status: Former Research scientist (life sciences)  . Smokeless tobacco: Never Used  Substance Use Topics  . Alcohol use: No    Current Meds  Medication Sig  . Multiple Vitamins-Minerals (ONE-A-DAY WOMENS 50 PLUS PO) Take 1 tablet by mouth daily.  Marland Kitchen perphenazine (TRILAFON) 8 MG tablet Take 2 tablets (16 mg total) by mouth at bedtime.  Marland Kitchen PROAIR HFA 108 (90 Base) MCG/ACT inhaler   . SYNTHROID 150 MCG tablet TAKE 1 TABLET BY MOUTH ONCE A DAY.  . traMADol (ULTRAM) 50 MG tablet Take 50 mg by mouth every 6 (six) hours as needed.  . [DISCONTINUED] levothyroxine (SYNTHROID) 150 MCG tablet Take 150 mcg  by mouth daily before breakfast.      Objective:   Today's Vitals: BP 130/70 (BP Location: Right Arm, Patient Position: Sitting, Cuff Size: Normal)   Pulse 77   Temp 98.3 F (36.8 C) (Temporal)  Vitals with BMI 09/12/2019 03/12/2018 01/29/2018  Height - 5\' 7"  5\' 7"   Weight - 224 lbs 210 lbs  BMI - 81.44 81.85  Systolic [phone]  Diastolic 70 79 378  Pulse 77 82 76  Some encounter information is confidential and restricted. Go to Review Flowsheets activity to see all data.     Physical Exam   She looks systemically well.  I did not examine her in the genital area today as I felt uncomfortable doing this and at the time did not have a chaperone.    Assessment   1. Vaginal discharge       Tests ordered Orders Placed This Encounter  Procedures  . Ambulatory referral to Obstetrics / Gynecology     Plan: 1. I will treat her empirically based on her history with a combination of fluconazole tablets and Lotrisone cream applied to the affected area. 2. I will refer her to gynecology as soon as  possible so she can be examined properly and further recommendations can be made.   Meds ordered this encounter  Medications  . fluconazole (DIFLUCAN) 150 MG tablet    Sig: Take 1 tablet (150 mg total) by mouth daily.    Dispense:  3 tablet    Refill:  0  . clotrimazole-betamethasone (LOTRISONE) cream    Sig: Apply 1 application topically 2 (two) times daily.    Dispense:  30 g    Refill:  0    Nimish Normajean Glasgow, MD

## 2019-09-12 NOTE — Progress Notes (Signed)
Virtual Visit via Telephone Note  I connected with Sherry Navarro on 09/12/19 at  2:20 PM EST by telephone and verified that I am speaking with the correct person using two identifiers.   I discussed the limitations, risks, security and privacy concerns of performing an evaluation and management service by telephone and the availability of in person appointments. I also discussed with the patient that there may be a patient responsible charge related to this service. The patient expressed understanding and agreed to proceed.    I discussed the assessment and treatment plan with the patient. The patient was provided an opportunity to ask questions and all were answered. The patient agreed with the plan and demonstrated an understanding of the instructions.   The patient was advised to call back or seek an in-person evaluation if the symptoms worsen or if the condition fails to improve as anticipated.  I provided 15 minutes of non-face-to-face time during this encounter.   Levonne Spiller, MD  [facility] MD/PA/NP OP Progress Note  09/12/2019 2:58 PM Sherry Navarro  MRN:  [id number]  Chief Complaint:  Chief Complaint    Schizophrenia; Follow-up     HPI: This patient is a 78 year old widowed white female who lives with her sister in [place]. She used to work as a Insurance claims handler  The patient states that she has had mental illness since her early 42s. She was not married to a man was running around on her and she had a breakdown and was diagnosed with schizophrenia. We don't have any documentation of this. She was on Navane for years but it was no longer being manufactured to several months ago she was put on perphenazine 16 mg per day. She has terrible shaking in her hands and some in her feet which is probably secondary to these neuroleptic medications. She claims she had this shaking even before she was on medicine but that it's difficult to believe. She claims that she tries to get off these medicines  she gets terrible headaches but I think we need to try given her shakiness. She claims she's tried Inderal Cogentin and benzodiazepines but nothing has helped.  Currently she has no paranoia auditory or visual hallucinations depression or suicidal ideation. She her sister go shopping every day and ought to be and she seems happy with her current lifestyle  The patient returns for follow-up after 5 months.  Prior to this her social worker had called earlier this week stating that the patient's sister had moved in with her own daughter and the patient was living in a house alone.  She is getting more confused.  They are trying to move her to an assisted living but the insurance has been an issue.  The patient herself tells me she wants to go because the house is infested with roaches.  However she is most worried about leaving her cat.  She was quite delusional today and talked about being able to talk directly to God and other higher powers and the fact that she was President Trump's mother at another life.  Once I can get her back on track to the here and now she is actually fairly lucid.  She refuses to take any other medicine other than perphenazine even though it is caused significant tremors.  I really do think she needs more supervision and needs to be in assisted living or skilled nursing care. Visit Diagnosis:    ICD-10-CM   1. 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 AC (acromioclavicular) joint bone spurs   . Hiatal hernia   . High cholesterol   . Osteoarthritis   . Schizophrenia ([facility])   . Seizures ([facility])   . Thyroid disorder     Past Surgical History:  Procedure Laterality Date  . back for spinal stenonsis not by Dr. [name]    . CHOLECYSTECTOMY    .  rt knee replacement not by Dr. [name]      Family Psychiatric History: see below  Family History:  Family History  Problem Relation Age of Onset  . Depression Other   . Schizophrenia Neg Hx   . ADD / ADHD Neg Hx   . Alcohol abuse Neg Hx   . Drug abuse Neg Hx   . Anxiety disorder Neg Hx   . Bipolar disorder Neg Hx   . Dementia Neg Hx   . OCD Neg Hx   . Paranoid behavior Neg Hx   . Seizures Neg Hx   . Sexual abuse Neg Hx   . Physical abuse Neg Hx     Social History:  Social History   Socioeconomic History  . Marital status: Widowed    Spouse name: Not on file  . Number of children: Not on file  . Years of education: Not on file  . Highest education level: Not on file  Occupational History  . Not on file  Social Needs  . Financial resource strain: Not on file  . Food insecurity    Worry: Not on file    Inability: Not on file  . Transportation needs    Medical: Not on file    Non-medical: Not on file  Tobacco Use  . Smoking status: Former Games developermoker  . Smokeless tobacco: Never Used  Substance and Sexual Activity  . Alcohol use: No  . Drug use: No  . Sexual activity: Never  Lifestyle  . Physical activity    Days per week: Not on file    Minutes per session: Not on file  . Stress: Not on file  Relationships  . Social Musicianconnections    Talks on phone: Not on file    Gets together: Not on file    Attends religious service: Not on file    Active member of club or organization: Not on file    Attends meetings of clubs or organizations: Not on file    Relationship status: Not on file  Other Topics Concern  . Not on file  Social History Narrative   Widower since 2010.Married for 37 years.Lives with 80 year old sister.Retired beautitician.    Allergies:  Allergies  Allergen Reactions  . Chlorpromazine Other (See Comments)    seizures unknown    Metabolic Disorder Labs: Lab Results  Component Value Date   HGBA1C 5.5 04/03/2012   MPG 111 04/03/2012   No  results found for: PROLACTIN No results found for: CHOL, TRIG, HDL, CHOLHDL, VLDL, LDLCALC No results found for: TSH  Therapeutic Level Labs: No results found for: LITHIUM No results found for: VALPROATE No components found for:  CBMZ  Current Medications: Current Outpatient Medications  Medication Sig Dispense Refill  . amLODipine (NORVASC) 5 MG tablet TAKE 1 TABLET BY MOUTH ONCE A DAY. 30 tablet 2  . clotrimazole-betamethasone (LOTRISONE) cream Apply 1 application topically 2 (two) times daily.  30 g 0  . fluconazole (DIFLUCAN) 150 MG tablet Take 1 tablet (150 mg total) by mouth daily. 3 tablet 0  . Multiple Vitamins-Minerals (ONE-A-DAY WOMENS 50 PLUS PO) Take 1 tablet by mouth daily.    Marland Kitchen NABUMETONE PO Take 750 mg by mouth daily. Reported on 12/15/2015    . pantoprazole (PROTONIX) 40 MG tablet TAKE ONE TABLET BY MOUTH ONCE DAILY. 30 tablet 3  . perphenazine (TRILAFON) 8 MG tablet Take 2 tablets (16 mg total) by mouth at bedtime. 180 tablet 2  . PROAIR HFA 108 (90 Base) MCG/ACT inhaler     . SYNTHROID 150 MCG tablet TAKE 1 TABLET BY MOUTH ONCE A DAY. 30 tablet 0  . traMADol (ULTRAM) 50 MG tablet Take 50 mg by mouth every 6 (six) hours as needed.     No current facility-administered medications for this visit.      Musculoskeletal: Strength & Muscle Tone: within normal limits Gait & Station: normal Patient leans: N/A  Psychiatric Specialty Exam: Review of Systems  Musculoskeletal: Positive for back pain, joint pain and myalgias.  All other systems reviewed and are negative.   There were no vitals taken for this visit.There is no height or weight on file to calculate BMI.  General Appearance: NA  Eye Contact:  NA  Speech:  Clear and Coherent  Volume:  Normal  Mood:  Anxious  Affect:  NA  Thought Process:  Disorganized and Descriptions of Associations: Circumstantial  Orientation:  Full (Time, Place, and Person)  Thought Content: Illogical and Delusions   Suicidal  Thoughts:  No  Homicidal Thoughts:  No  Memory:  Immediate;   Good Recent;   Fair Remote;   Poor  Judgement:  Impaired  Insight:  Lacking  Psychomotor Activity:  Decreased  Concentration:  Concentration: Fair and Attention Span: Fair  Recall:  [employer] of Knowledge: Fair  Language: Good  Akathisia:  No  Handed:  Right  AIMS (if indicated): not done  Assets:  Communication Skills Desire for Improvement Resilience  ADL's:  Intact  Cognition: Impaired,  Mild  Sleep:  Fair   Screenings: AIMS     Office Visit from 10/01/2018 in [facility]  AIMS Total Score  23    PHQ2-9     Patient Outreach Telephone from 04/19/2018 in [facility]  PHQ-2 Total Score  4  PHQ-9 Total Score  9       Assessment and Plan: This patient is an 78 year old female with a history of schizophrenia.  She still has significant delusions and seems to be more disorganized.  I strongly support the social workers view that she needs more supervision.  Fortunately right now the patient is agreeing to do this.  As usual she does not want to change her medication so we will continue with perphenazine 16 mg at bedtime.  Perhaps if she is in a more supervised setting we can make some changes.  She will return to see me in 3 months   Diannia Ruder, MD 09/12/2019, 2:58 PM

## 2019-09-13 ENCOUNTER — Other Ambulatory Visit (INDEPENDENT_AMBULATORY_CARE_PROVIDER_SITE_OTHER): Payer: Self-pay | Admitting: Internal Medicine

## 2019-09-18 ENCOUNTER — Ambulatory Visit (HOSPITAL_COMMUNITY): Payer: PPO | Admitting: Psychiatry

## 2019-09-18 ENCOUNTER — Other Ambulatory Visit: Payer: Self-pay

## 2019-09-20 ENCOUNTER — Telehealth: Payer: Self-pay | Admitting: Obstetrics and Gynecology

## 2019-09-20 ENCOUNTER — Other Ambulatory Visit (INDEPENDENT_AMBULATORY_CARE_PROVIDER_SITE_OTHER): Payer: Self-pay | Admitting: Nurse Practitioner

## 2019-09-20 DIAGNOSIS — E039 Hypothyroidism, unspecified: Secondary | ICD-10-CM

## 2019-09-20 DIAGNOSIS — E559 Vitamin D deficiency, unspecified: Secondary | ICD-10-CM

## 2019-09-20 NOTE — Addendum Note (Signed)
Addended by: Jeralyn Ruths E on: 09/20/2019 01:47 PM   Modules accepted: Orders

## 2019-09-20 NOTE — Telephone Encounter (Signed)

## 2019-09-23 ENCOUNTER — Encounter: Payer: Self-pay | Admitting: Obstetrics and Gynecology

## 2019-09-23 ENCOUNTER — Ambulatory Visit (INDEPENDENT_AMBULATORY_CARE_PROVIDER_SITE_OTHER): Payer: PPO | Admitting: Obstetrics and Gynecology

## 2019-09-23 ENCOUNTER — Other Ambulatory Visit: Payer: Self-pay

## 2019-09-23 DIAGNOSIS — N952 Postmenopausal atrophic vaginitis: Secondary | ICD-10-CM | POA: Diagnosis not present

## 2019-09-23 DIAGNOSIS — B354 Tinea corporis: Secondary | ICD-10-CM | POA: Insufficient documentation

## 2019-09-23 MED ORDER — PREMARIN 0.625 MG/GM VA CREA: 1.0000 | TOPICAL_CREAM | VAGINAL | 2 refills | Status: DC

## 2019-09-23 MED ORDER — KETOCONAZOLE 2 % EX CREA: 1.0000 "application " | TOPICAL_CREAM | Freq: Every day | CUTANEOUS | 1 refills | Status: DC

## 2019-09-23 NOTE — Progress Notes (Signed)
Patient ID: Sherry Navarro, female   DOB: 1941-07-20, 78 y.o.   MRN: [id number]    [facility] Visit  @DATE @            Patient name: Sherry Navarro MRN Marti Sleigh  Date of birth: November 27, 1940  CC & HPI:  Sherry Navarro is a 78 y.o. female NEW GYN presenting today for vaginal discharge. Her knees has popped out of place and is making preparations to move into a nursing facility. She has had 2-3 falls. She believes it would be difficult to put medication up into the vagina due to difficulty of spreading legs due to knees. Being placed in [place]  ROS:  ROS +Tinea corpus  +stress incontinence -fever  Pertinent History Reviewed:   Reviewed:  Medical         Past Medical History:  Diagnosis Date  . AC (acromioclavicular) joint bone spurs   . Hiatal hernia   . High cholesterol   . Osteoarthritis   . Schizophrenia ([facility])   . Seizures ([facility])   . Thyroid disorder                               Surgical Hx:    Past Surgical History:  Procedure Laterality Date  . back for spinal stenonsis not by Dr. 96    . CHOLECYSTECTOMY    . rt knee replacement not by Dr. Romeo Apple     Medications: Reviewed & Updated - see associated section                       Current Outpatient Medications:  .  amLODipine (NORVASC) 5 MG tablet, TAKE 1 TABLET BY MOUTH ONCE A DAY., Disp: 30 tablet, Rfl: 2 .  clotrimazole-betamethasone (LOTRISONE) cream, Apply 1 application topically 2 (two) times daily., Disp: 30 g, Rfl: 0 .  fluconazole (DIFLUCAN) 150 MG tablet, Take 1 tablet (150 mg total) by mouth daily., Disp: 3 tablet, Rfl: 0 .  Multiple Vitamins-Minerals (ONE-A-DAY WOMENS 50 PLUS PO), Take 1 tablet by mouth daily., Disp: , Rfl:  .  nabumetone (RELAFEN) 750 MG tablet, TAKE (1) TABLET BY MOUTH TWICE DAILY., Disp: 60 tablet, Rfl: 0 .  NABUMETONE PO, Take 750 mg by mouth daily. Reported on 12/15/2015, Disp: , Rfl:  .  pantoprazole (PROTONIX) 40 MG tablet, TAKE ONE TABLET BY MOUTH ONCE DAILY., Disp:  30 tablet, Rfl: 3 .  perphenazine (TRILAFON) 8 MG tablet, Take 2 tablets (16 mg total) by mouth at bedtime., Disp: 180 tablet, Rfl: 2 .  PROAIR HFA 108 (90 Base) MCG/ACT inhaler, , Disp: , Rfl:  .  SYNTHROID 150 MCG tablet, TAKE 1 TABLET BY MOUTH ONCE A DAY., Disp: 30 tablet, Rfl: 0 .  traMADol (ULTRAM) 50 MG tablet, Take 50 mg by mouth every 6 (six) hours as needed., Disp: , Rfl:    Social History: Reviewed -  reports that she has quit smoking. She has never used smokeless tobacco.  Objective Findings:  Vitals: There were no vitals taken for this visit.  PHYSICAL EXAMINATION General appearance - alert, well appearing, and in no distress and normal appearing weight Mental status - alert, oriented to person, place, and time, normal mood, behavior, speech, dress, motor activity, and thought processes, anxious Abdomen - Yeast infection under abdomen above mons pubis PELVIC External genitalia - atrophic postmenopausal Vagina - erythematous  Cervix - yellowish red discharge  Uterus -  not examied [place] - parabasal cells  Assessment & Plan:   A:  1. Groin tinea corporis 2. Postmenopausal atrophic vaginitis  P:  1. Premarin vaginal cream 2. Ketoconazole 2% cream    By signing my name below, I, Samul Dada, attest that this documentation has been prepared under the direction and in the presence of Jonnie Kind, MD. Electronically Signed: [place]. 09/23/19. 1:43 PM.  I personally performed the services described in this documentation, which was SCRIBED in my presence. The recorded information has been reviewed and considered accurate. It has been edited as necessary during review. Jonnie Kind, MD

## 2019-09-24 ENCOUNTER — Ambulatory Visit (INDEPENDENT_AMBULATORY_CARE_PROVIDER_SITE_OTHER): Payer: PPO | Admitting: Nurse Practitioner

## 2019-09-24 ENCOUNTER — Telehealth (INDEPENDENT_AMBULATORY_CARE_PROVIDER_SITE_OTHER): Payer: Self-pay | Admitting: Nurse Practitioner

## 2019-09-24 ENCOUNTER — Encounter (INDEPENDENT_AMBULATORY_CARE_PROVIDER_SITE_OTHER): Payer: Self-pay | Admitting: Nurse Practitioner

## 2019-09-24 DIAGNOSIS — E039 Hypothyroidism, unspecified: Secondary | ICD-10-CM | POA: Diagnosis not present

## 2019-09-24 DIAGNOSIS — F209 Schizophrenia, unspecified: Secondary | ICD-10-CM | POA: Diagnosis not present

## 2019-09-24 DIAGNOSIS — E559 Vitamin D deficiency, unspecified: Secondary | ICD-10-CM | POA: Diagnosis not present

## 2019-09-24 DIAGNOSIS — N898 Other specified noninflammatory disorders of vagina: Secondary | ICD-10-CM | POA: Diagnosis not present

## 2019-09-24 DIAGNOSIS — M1990 Unspecified osteoarthritis, unspecified site: Secondary | ICD-10-CM | POA: Diagnosis not present

## 2019-09-24 HISTORY — DX: Vitamin D deficiency, unspecified: E55.9

## 2019-09-24 HISTORY — DX: Unspecified osteoarthritis, unspecified site: M19.90

## 2019-09-24 HISTORY — DX: Hypothyroidism, unspecified: E03.9

## 2019-09-24 MED ORDER — FLUCONAZOLE 150 MG PO TABS: 150.0000 mg | ORAL_TABLET | Freq: Every day | ORAL | 0 refills | Status: DC

## 2019-09-24 MED ORDER — TRAMADOL HCL 50 MG PO TABS: 50.0000 mg | ORAL_TABLET | Freq: Two times a day (BID) | ORAL | 0 refills | Status: DC | PRN

## 2019-09-24 NOTE — Assessment & Plan Note (Signed)
I encouraged her to use the creams prescribed to her by her OB/GYN.  She tells me that last time she took Diflucan by mouth this seemed to help more than the creams.  Thus I have refilled this for her today.

## 2019-09-24 NOTE — Assessment & Plan Note (Signed)
She will continue on her current dose of vitamin D3 daily.  She will need to consider collecting serum vitamin D at her next office visit.

## 2019-09-24 NOTE — Assessment & Plan Note (Signed)
She was encouraged to follow-up with her psychiatrist as scheduled and to continue taking her antipsychotic as prescribed.  She tells me she understands.

## 2019-09-24 NOTE — Telephone Encounter (Signed)
I attempted to contact Betsey Amen who is Education officer, museum working with this patient.  I am calling to check in to see what placement status is with this patient.  Patient told me today that they are trying to place her in [place].  Message left asking Melissa to call me back.

## 2019-09-24 NOTE — Assessment & Plan Note (Signed)
Patient to continue on current dose of Synthroid at this time.  I discussed in detail the need to check her thyroid panel, but she is unwilling to come to the office at this time.  She does have transportation difficulty but has used [facility] successfully in the past.  We will check serum thyroid level at her next office visit, she tells me she thinks she will be able to have [facility] set up to help her with transportation so she can get here then.

## 2019-09-24 NOTE — Progress Notes (Signed)
Due to national recommendations of social distancing related to the [place] pandemic, an audio/visual tele-health visit was felt to be the most appropriate encounter type for this patient today. I connected with  Sherry Navarro on 09/24/19 utilizing audio-only technology and verified that I am speaking with the correct person using two identifiers.  Unfortunately this patient does not have technology available to her that allows her to use video.  Thus the visit was completed using telephone only.  The patient was located at their home, and I was located at the office of [facility] during the encounter. I discussed the limitations of evaluation and management by telemedicine. The patient expressed understanding and agreed to proceed.    Subjective:  Patient ID: Sherry Navarro, female    DOB: 02/21/1941  Age: 78 y.o. MRN: [id number]  CC:  Chief Complaint  Patient presents with  . Knee Pain  . Back Pain  . Hypothyroidism  . other    vitamin d deficiency  . Schizophrenia  . Vaginal Discharge      HPI  Knee pain/back pain: Patient has a history of osteoarthritis and back pain that is chronic.  She has been recommended to see orthopedist multiple times.  She has tried multiple treatment regimens including NSAIDs, steroids, opioids, etc.  She has obtained minimal relief from all treatment plans.  Hypothyroidism: Patient continues on Synthroid 150 mg daily.  Last thyroid panel was collected in May 2020.  Vitamin D deficiency: She also has a history of vitamin D deficiency.  She was increased from 2000 international units daily to 4000 international units daily of vitamin D.  At her last office visit.  She does tell me that she has been taking this medication as directed.  Schizophrenia: She also has a history of schizophrenia.  She is followed by psychiatry for this.  She reports taking her antipsychotic as prescribed.  She denies any hallucinations today.  Vaginal discharge: She  was seen by OB/GYN yesterday for her vaginal discharge.  She tells me she was prescribed vaginal cream for this.  She tells me it is painful to administer.  She is concerned because she is having a hard time bathing herself and feels that her symptoms would improve if she were receiving help with bathing.  Adult social services is following this patient and the patient tells me they are in the process of trying to get her placed in a skilled nursing facility for ADL assistance.  Past Medical History:  Diagnosis Date  . AC (acromioclavicular) joint bone spurs   . Arthritis 09/24/2019  . Hiatal hernia   . High cholesterol   . Hypothyroidism 09/24/2019  . Osteoarthritis   . Schizophrenia ([place])   . Seizures ([place])   . Thyroid disorder   . Vitamin D deficiency 09/24/2019      Family History  Problem Relation Age of Onset  . Depression Other   . Schizophrenia Neg Hx   . ADD / ADHD Neg Hx   . Alcohol abuse Neg Hx   . Drug abuse Neg Hx   . Anxiety disorder Neg Hx   . Bipolar disorder Neg Hx   . Dementia Neg Hx   . OCD Neg Hx   . Paranoid behavior Neg Hx   . Seizures Neg Hx   . Sexual abuse Neg Hx   . Physical abuse Neg Hx     Social History   Social History Narrative   Widower since 2010.Married for 37  years.Lives with 80 year old sister.Retired beautitician.   Social History   Tobacco Use  . Smoking status: Former Games developermoker  . Smokeless tobacco: Never Used  Substance Use Topics  . Alcohol use: No     Current Meds  Medication Sig  . amLODipine (NORVASC) 5 MG tablet TAKE 1 TABLET BY MOUTH ONCE A DAY.  . cholecalciferol (VITAMIN D3) 25 MCG (1000 UT) tablet Take 4,000 Units by mouth daily.  Marland Kitchen. conjugated estrogens (PREMARIN) vaginal cream Place 1 Applicatorful vaginally 3 (three) times a week. For vaginal thinning and irritation  . ketoconazole (NIZORAL) 2 % cream Apply 1 application topically daily. Apply to irritated skin when dry.5x/wk  . Multiple Vitamins-Minerals  (ONE-A-DAY WOMENS 50 PLUS PO) Take 1 tablet by mouth daily.  . nabumetone (RELAFEN) 750 MG tablet TAKE (1) TABLET BY MOUTH TWICE DAILY.  . pantoprazole (PROTONIX) 40 MG tablet TAKE ONE TABLET BY MOUTH ONCE DAILY.  Marland Kitchen. perphenazine (TRILAFON) 8 MG tablet Take 2 tablets (16 mg total) by mouth at bedtime.  Marland Kitchen. SYNTHROID 150 MCG tablet TAKE 1 TABLET BY MOUTH ONCE A DAY.    ROS:  Review of Systems  Constitutional: Negative for chills and fever.  Respiratory: Positive for cough (intermittently). Negative for shortness of breath and wheezing.   Cardiovascular: Positive for palpitations. Negative for chest pain.  Gastrointestinal: Negative for diarrhea.  Genitourinary: Negative for dysuria.       (+) Vaginal Discharge  Musculoskeletal: Positive for back pain and joint pain.  Neurological: Positive for dizziness.  Psychiatric/Behavioral: Positive for depression. Negative for hallucinations and suicidal ideas.     Objective:   Today's Vitals: There were no vitals taken for this visit. Vitals with BMI 09/24/2019 09/23/2019 09/12/2019  Height - 5\' 7"  (No Data)  Weight - 205 lbs 6 oz (No Data)  BMI - 32.16 -  Systolic (No Data) 124 130  Diastolic (No Data) 80 70  Pulse - 80 77  Some encounter information is confidential and restricted. Go to Review Flowsheets activity to see all data.     Physical Exam exam not completed today as office visit was conducted over the phone.  Patient was alert and oriented x3.  She sounded okay over the phone, she did express anxieties and worries over nursing facility placement as well as what will happen to her pet cat when she is placed.       Assessment   1. Schizophrenia, unspecified type (HCC)   2. Vitamin D deficiency   3. Hypothyroidism, unspecified type   4. Arthritis   5. Vaginal discharge       Tests ordered No orders of the defined types were placed in this encounter.    Plan: Please see assessment and plan per problem list  below.   Meds ordered this encounter  Medications  . fluconazole (DIFLUCAN) 150 MG tablet    Sig: Take 1 tablet (150 mg total) by mouth daily.    Dispense:  1 tablet    Refill:  0    Order Specific Question:   Supervising Provider    Answer:   Karilyn CotaGOSRANI, NIMISH C [[id number]]  . traMADol (ULTRAM) 50 MG tablet    Sig: Take 1 tablet (50 mg total) by mouth every 12 (twelve) hours as needed for up to 3 days for moderate pain or severe pain (Take as needed for breakthrough pain).    Dispense:  6 tablet    Refill:  0    Order Specific Question:   Supervising Provider  AnswerWilson Singer [[id number]]    Patient to follow-up in 2 months.  She was encouraged to call the office with any questions or concerns prior to that upcoming appointment.  The telephone call lasted for greater than 40 minutes.  Elenore Paddy, NP

## 2019-09-24 NOTE — Assessment & Plan Note (Signed)
I discussed different treatment regimen options.  She does not want to change her current NSAID, she does not want take a short course of steroids, she tells me she cannot take Tylenol because she gets migraine headaches as a side effect from it.  She has tried low-dose tramadol in the past with mild improvement.  I did check the [place] controlled substance database and see that she has not been prescribed a controlled substance in [place] or [place] since August 2020.  At that time she was prescribed tramadol.  I have prescribed her a short course of tramadol that she can take as needed, but again highly encouraged her and discussed the need to be seen by an orthopedist to treat the underlying problem.  She is unwilling to see orthopedist at this time.

## 2019-09-30 ENCOUNTER — Other Ambulatory Visit (INDEPENDENT_AMBULATORY_CARE_PROVIDER_SITE_OTHER): Payer: Self-pay | Admitting: Nurse Practitioner

## 2019-09-30 ENCOUNTER — Telehealth (INDEPENDENT_AMBULATORY_CARE_PROVIDER_SITE_OTHER): Payer: Self-pay | Admitting: Internal Medicine

## 2019-09-30 DIAGNOSIS — M1990 Unspecified osteoarthritis, unspecified site: Secondary | ICD-10-CM

## 2019-09-30 NOTE — Telephone Encounter (Signed)
done

## 2019-10-01 ENCOUNTER — Telehealth (HOSPITAL_COMMUNITY): Payer: Self-pay | Admitting: Psychiatry

## 2019-10-02 ENCOUNTER — Ambulatory Visit (HOSPITAL_COMMUNITY): Payer: PPO | Admitting: Psychiatry

## 2019-10-09 ENCOUNTER — Telehealth (INDEPENDENT_AMBULATORY_CARE_PROVIDER_SITE_OTHER): Payer: Self-pay

## 2019-10-09 NOTE — Telephone Encounter (Signed)
fl2 faxed to be signed and sent back.

## 2019-10-10 ENCOUNTER — Encounter: Payer: Self-pay | Admitting: *Deleted

## 2019-10-10 NOTE — Patient Outreach (Signed)
[facility]) Care Management  10/10/2019  Sherry Navarro 07-10-1941 [id number]   HTA Referral for NP case review and suggestions, outreach to pt's psychiatrist with goal to assist with LTC placement.  Pt does she her providers regularly.  Dr. Harrington Challenger has recommended that pt have increased supervision either in ALF or SNF on her visit 09/12/19.  Will work with care team to assist in transition to [place].  Eulah Pont. Myrtie Neither, MSN, [facility] Gerontological Nurse Practitioner [facility] Care Management [phone]

## 2019-10-12 ENCOUNTER — Other Ambulatory Visit (INDEPENDENT_AMBULATORY_CARE_PROVIDER_SITE_OTHER): Payer: Self-pay | Admitting: Internal Medicine

## 2019-10-15 ENCOUNTER — Telehealth (HOSPITAL_COMMUNITY): Payer: Self-pay | Admitting: Psychiatry

## 2019-10-16 ENCOUNTER — Encounter (HOSPITAL_COMMUNITY): Payer: Self-pay | Admitting: Psychiatry

## 2019-10-17 ENCOUNTER — Encounter (HOSPITAL_COMMUNITY): Payer: Self-pay | Admitting: Psychiatry

## 2019-10-21 ENCOUNTER — Telehealth (INDEPENDENT_AMBULATORY_CARE_PROVIDER_SITE_OTHER): Payer: Self-pay

## 2019-10-22 NOTE — Telephone Encounter (Signed)
Sherry Navarro, called to give the status of  Sherry Navarro. Pt is now at  Sherry Navarro's house. Pt is completely moved.  Will send the office a detail report of transition. Ask case worker to notify that we will still be doing a virtual visits; sending people in the home for assistance with these visits.

## 2019-11-06 ENCOUNTER — Ambulatory Visit (HOSPITAL_COMMUNITY): Payer: PPO | Admitting: Psychiatry

## 2019-11-06 ENCOUNTER — Other Ambulatory Visit: Payer: Self-pay

## 2019-11-20 ENCOUNTER — Telehealth (INDEPENDENT_AMBULATORY_CARE_PROVIDER_SITE_OTHER): Payer: Self-pay

## 2019-11-20 NOTE — Telephone Encounter (Signed)
I was able to contact Cordelia Pen any answer her questions.  She also gave me the number to the facility in which Millry now lives.  [facility] is at [facility].  Main office number is [phone]  I called [facility] to determine whether or not the patient is being seen by rounding physician in the facility.  The staff member tells me that rounding physician does check on the patient there.  She gave me her administrators number to discuss further.  His name is Whitman Hero, cell is [phone] and home is [phone].  I did call him and told him that she is scheduled for an appointment with [place] next week.  He was wondering if he could do it virtually.  I told him that we could.  He tells me he will let his staff know and they will call us with the correct number for [place] to call to complete appointment virtually via video.

## 2019-11-20 NOTE — Telephone Encounter (Signed)
Cordelia Pen is calling in regards to Sherry Navarro asking about a medication that the patient is stating she takes daily for constipation but we have no record that the patient is taking this medication on a daily basis. The state is doing an audit on the facility that she is in and they would like to speak to a Dr or NP or PA in regards to Sherry Navarro

## 2019-11-26 ENCOUNTER — Encounter (INDEPENDENT_AMBULATORY_CARE_PROVIDER_SITE_OTHER): Payer: Self-pay | Admitting: Internal Medicine

## 2019-11-26 ENCOUNTER — Other Ambulatory Visit (INDEPENDENT_AMBULATORY_CARE_PROVIDER_SITE_OTHER): Payer: Self-pay | Admitting: Internal Medicine

## 2019-11-26 ENCOUNTER — Ambulatory Visit (INDEPENDENT_AMBULATORY_CARE_PROVIDER_SITE_OTHER): Payer: PPO | Admitting: Internal Medicine

## 2019-11-26 ENCOUNTER — Other Ambulatory Visit: Payer: Self-pay

## 2019-11-26 DIAGNOSIS — K5900 Constipation, unspecified: Secondary | ICD-10-CM | POA: Diagnosis not present

## 2019-11-26 MED ORDER — DOCUSATE SODIUM 100 MG PO CAPS: 100.0000 mg | ORAL_CAPSULE | Freq: Two times a day (BID) | ORAL | 3 refills | Status: DC | PRN

## 2019-11-26 NOTE — Progress Notes (Signed)
Metrics: Intervention Frequency ACO  Documented Smoking Status Yearly  Screened one or more times in 24 months  Cessation Counseling or  Active cessation medication Past 24 months  Past 24 months   Guideline developer: UpToDate (See UpToDate for funding source) Date Released: 2014       Wellness Office Visit  Subjective:  Patient ID: Sherry Navarro, female    DOB: Sep 17, 1941  Age: 79 y.o. MRN: [id number]  CC: This is a virtual phone call visit with the permission of the patient who is at the assisted living facility.  I spoke to her on the phone and easily recognized her voice. Her main complaint today is constipation. HPI She is having trouble with constipation and the caregiver present feels that she needs a stool softener.  She is not having any abdominal pain, fever, nausea or vomiting.  Past Medical History:  Diagnosis Date  . AC (acromioclavicular) joint bone spurs   . Arthritis 09/24/2019  . Hiatal hernia   . High cholesterol   . Hypothyroidism 09/24/2019  . Osteoarthritis   . Schizophrenia ([place])   . Seizures ([place])   . Thyroid disorder   . Vitamin D deficiency 09/24/2019      Family History  Problem Relation Age of Onset  . Depression Other   . Schizophrenia Neg Hx   . ADD / ADHD Neg Hx   . Alcohol abuse Neg Hx   . Drug abuse Neg Hx   . Anxiety disorder Neg Hx   . Bipolar disorder Neg Hx   . Dementia Neg Hx   . OCD Neg Hx   . Paranoid behavior Neg Hx   . Seizures Neg Hx   . Sexual abuse Neg Hx   . Physical abuse Neg Hx     Social History   Social History Narrative   Widower since 2010.Married for 37 years.Lives with 65 year old sister.Retired beautitician.   Social History   Tobacco Use  . Smoking status: Former Research scientist (life sciences)  . Smokeless tobacco: Never Used  Substance Use Topics  . Alcohol use: No    Current Meds  Medication Sig  . amLODipine (NORVASC) 5 MG tablet TAKE 1 TABLET BY MOUTH ONCE A DAY.  . cholecalciferol (VITAMIN D3) 25 MCG (1000 UT)  tablet Take 4,000 Units by mouth daily.  Marland Kitchen conjugated estrogens (PREMARIN) vaginal cream Place 1 Applicatorful vaginally 3 (three) times a week. For vaginal thinning and irritation  . Multiple Vitamins-Minerals (ONE-A-DAY WOMENS 50 PLUS PO) Take 1 tablet by mouth daily.  . nabumetone (RELAFEN) 750 MG tablet TAKE (1) TABLET BY MOUTH TWICE DAILY.  Marland Kitchen perphenazine (TRILAFON) 8 MG tablet Take 2 tablets (16 mg total) by mouth at bedtime.  Marland Kitchen SYNTHROID 150 MCG tablet TAKE 1 TABLET BY MOUTH ONCE A DAY.  . traMADol (ULTRAM) 50 MG tablet TAKE 1 TABLET BY MOUTH EVERY (12) HOURS AS NEEDED x UP TO THREE DAYS FOR BREAKTHROUGH PAIN.      Objective:   Today's Vitals: There were no vitals taken for this visit. Vitals with BMI 09/24/2019 09/23/2019 09/12/2019  Height - 5\' 7"  (No Data)  Weight - 205 lbs 6 oz (No Data)  BMI - 11.94 -  Systolic (No Data) 174 081  Diastolic (No Data) 80 70  Pulse - 80 77  Some encounter information is confidential and restricted. Go to Review Flowsheets activity to see all data.     Physical Exam   Her voice appeared normal on the phone and she appeared to  be alert and orientated.    Assessment   1. Constipation, unspecified constipation type       Tests ordered No orders of the defined types were placed in this encounter.    Plan: 1. I am going to send some Colace as needed twice a day to her pharmacy to see if this will help her. 2. Follow-up with Maralyn Sago in about 3 months in the office hopefully. 3. This phone call lasted 6 minutes and 30 seconds.   Meds ordered this encounter  Medications  . docusate sodium (COLACE) 100 MG capsule    Sig: Take 1 capsule (100 mg total) by mouth 2 (two) times daily as needed for mild constipation.    Dispense:  60 capsule    Refill:  3    Kymia Simi Normajean [place], MD

## 2019-12-09 ENCOUNTER — Telehealth (INDEPENDENT_AMBULATORY_CARE_PROVIDER_SITE_OTHER): Payer: Self-pay | Admitting: Internal Medicine

## 2019-12-11 NOTE — Telephone Encounter (Signed)
done

## 2019-12-16 ENCOUNTER — Other Ambulatory Visit (INDEPENDENT_AMBULATORY_CARE_PROVIDER_SITE_OTHER): Payer: Self-pay | Admitting: Internal Medicine

## 2019-12-16 DIAGNOSIS — F202 Catatonic schizophrenia: Secondary | ICD-10-CM

## 2020-01-28 ENCOUNTER — Other Ambulatory Visit (INDEPENDENT_AMBULATORY_CARE_PROVIDER_SITE_OTHER): Payer: Self-pay | Admitting: Internal Medicine

## 2020-01-28 DIAGNOSIS — F202 Catatonic schizophrenia: Secondary | ICD-10-CM

## 2020-02-17 ENCOUNTER — Other Ambulatory Visit (INDEPENDENT_AMBULATORY_CARE_PROVIDER_SITE_OTHER): Payer: Self-pay | Admitting: Nurse Practitioner

## 2020-02-26 ENCOUNTER — Ambulatory Visit (INDEPENDENT_AMBULATORY_CARE_PROVIDER_SITE_OTHER): Payer: PPO | Admitting: Nurse Practitioner

## 2020-02-26 ENCOUNTER — Ambulatory Visit (INDEPENDENT_AMBULATORY_CARE_PROVIDER_SITE_OTHER): Payer: PPO | Admitting: Internal Medicine

## 2020-03-19 ENCOUNTER — Encounter (INDEPENDENT_AMBULATORY_CARE_PROVIDER_SITE_OTHER): Payer: Self-pay | Admitting: Internal Medicine

## 2020-03-19 ENCOUNTER — Ambulatory Visit (INDEPENDENT_AMBULATORY_CARE_PROVIDER_SITE_OTHER): Payer: PPO | Admitting: Internal Medicine

## 2020-03-19 ENCOUNTER — Other Ambulatory Visit: Payer: Self-pay

## 2020-03-19 VITALS — BP 145/75 | HR 79 | Temp 97.9°F | Ht 67.0 in | Wt 221.2 lb

## 2020-03-19 DIAGNOSIS — E039 Hypothyroidism, unspecified: Secondary | ICD-10-CM | POA: Diagnosis not present

## 2020-03-19 DIAGNOSIS — F209 Schizophrenia, unspecified: Secondary | ICD-10-CM | POA: Diagnosis not present

## 2020-03-19 DIAGNOSIS — I10 Essential (primary) hypertension: Secondary | ICD-10-CM

## 2020-03-19 DIAGNOSIS — M25562 Pain in left knee: Secondary | ICD-10-CM

## 2020-03-19 DIAGNOSIS — M79672 Pain in left foot: Secondary | ICD-10-CM | POA: Diagnosis not present

## 2020-03-19 DIAGNOSIS — G8929 Other chronic pain: Secondary | ICD-10-CM | POA: Diagnosis not present

## 2020-03-19 DIAGNOSIS — E559 Vitamin D deficiency, unspecified: Secondary | ICD-10-CM | POA: Diagnosis not present

## 2020-03-19 NOTE — Progress Notes (Signed)
Metrics: Intervention Frequency ACO  Documented Smoking Status Yearly  Screened one or more times in 24 months  Cessation Counseling or  Active cessation medication Past 24 months  Past 24 months   Guideline developer: UpToDate (See UpToDate for funding source) Date Released: 2014       Wellness Office Visit  Subjective:  Patient ID: Sherry Navarro, female    DOB: 1940/11/09  Age: 79 y.o. MRN: [id number]  CC: This lady comes in for follow-up of hypothyroidism, hypertension, schizophrenia, vitamin D deficiency. HPI  She continues to complain of constipation.  She is now living in an [facility] in [place]. She is complaining of left foot pain and she has a deformity to that here.  She is to see Dr. Posey Pronto podiatrist in  [place].  She is also complaining of left knee pain which is a chronic problem.  She takes tramadol.  This seems to help her. Past Medical History:  Diagnosis Date  . AC (acromioclavicular) joint bone spurs   . Arthritis 09/24/2019  . Hiatal hernia   . High cholesterol   . Hypothyroidism 09/24/2019  . Osteoarthritis   . Schizophrenia ([place])   . Seizures ([place])   . Thyroid disorder   . Vitamin D deficiency 09/24/2019      Family History  Problem Relation Age of Onset  . Depression Other   . Schizophrenia Neg Hx   . ADD / ADHD Neg Hx   . Alcohol abuse Neg Hx   . Drug abuse Neg Hx   . Anxiety disorder Neg Hx   . Bipolar disorder Neg Hx   . Dementia Neg Hx   . OCD Neg Hx   . Paranoid behavior Neg Hx   . Seizures Neg Hx   . Sexual abuse Neg Hx   . Physical abuse Neg Hx     Social History   Social History Narrative   Widower since 2010.Married for 37 years.Lives with 64 year old sister.Retired beautitician.   Social History   Tobacco Use  . Smoking status: Former Research scientist (life sciences)  . Smokeless tobacco: Never Used  Substance Use Topics  . Alcohol use: No    Current Meds  Medication Sig  . amLODipine (NORVASC) 5 MG tablet TAKE  (1) TABLET BY MOUTH ONCE DAILY.  Marland Kitchen Cholecalciferol (VITAMIN D3) 125 MCG (5000 UT) CAPS TAKE 1 CAPSULE BY MOUTH ONCE A DAY.  Marland Kitchen docusate sodium (COLACE) 100 MG capsule TAKE (1) TABLET BY MOUTH TWICE A DAY AS NEEDED FOR MILD CONSTIPATION  . meloxicam (MOBIC) 7.5 MG tablet TAKE (1) TABLET BY MOUTH ONCE DAILY.  Marland Kitchen perphenazine (TRILAFON) 8 MG tablet TAKE 2 TABLETS BY MOUTH AT BEDTIME.  . traMADol (ULTRAM) 50 MG tablet TAKE 1 TABLET BY MOUTH EVERY (12) HOURS AS NEEDED x UP TO THREE DAYS FOR BREAKTHROUGH PAIN.  . [DISCONTINUED] perphenazine (TRILAFON) 8 MG tablet Take 8 mg by mouth 2 (two) times daily.      Depression screen [facility] 2/9 03/19/2020 04/19/2018  Decreased Interest 0 1  Down, Depressed, Hopeless 0 3  PHQ - 2 Score 0 4  Altered sleeping - 0  Tired, decreased energy - 3  Change in appetite - 0  Feeling bad or failure about yourself  - 1  Trouble concentrating - 0  Moving slowly or fidgety/restless - 1  Suicidal thoughts - 0  PHQ-9 Score - 9  Difficult doing work/chores - Somewhat difficult     Objective:   Today's Vitals: BP Marland Kitchen)  145/75 (BP Location: Left Arm, Patient Position: Sitting, Cuff Size: Normal)   Pulse 79   Temp 97.9 F (36.6 C) (Temporal)   Ht 5\' 7"  (1.702 m)   Wt 221 lb 3.2 oz (100.3 kg)   SpO2 96%   BMI 34.64 kg/m  Vitals with BMI 03/19/2020 11/26/2019 09/24/2019  Height 5\' 7"  (No Data) -  Weight 221 lbs 3 oz (No Data) -  BMI 34.64 - -  Systolic 145 (No Data) (No Data)  Diastolic 75 (No Data) (No Data)  Pulse 79 - -  Some encounter information is confidential and restricted. Go to Review Flowsheets activity to see all data.     Physical Exam   Her left knee is slightly swollen but is not warm or tender. The left foot shows a valgus sort of deformity at the left big toe. She remains obese.  Blood pressure readings not too bad for her age.  She appears to be alert and oriented.   Assessment   1. Vitamin D deficiency   2. Schizophrenia, unspecified type  (HCC)   3. Essential hypertension, benign   4. Hypothyroidism, unspecified type   5. Left foot pain   6. Chronic pain of left knee       Tests ordered Orders Placed This Encounter  Procedures  . COMPLETE METABOLIC PANEL WITH GFR  . T3, free  . T4  . TSH  . VITAMIN D 25 Hydroxy (Vit-D Deficiency, Fractures)  . Ambulatory referral to Podiatry  . Ambulatory referral to Orthopedic Surgery     Plan: 1. Blood work was taken. 2. She will continue with vitamin D3 supplementation for vitamin D deficiency and we will check levels today. 3. She will continue with antihypertensive therapy and this seems to be controlling her blood pressure reasonably well. 4. I will refer to podiatry again for the left foot deformity to see if anything can be done. 5. I will refer to orthopedics in [facility] for the left knee pain. 6. Further recommendations will depend on the results and she will follow-up with Sarah in about 3 months time.   No orders of the defined types were placed in this encounter.   , MD

## 2020-03-20 LAB — COMPLETE METABOLIC PANEL WITH GFR
AG Ratio: 1.6 (calc) (ref 1.0–2.5)
ALT: 14 U/L (ref 6–29)
AST: 22 U/L (ref 10–35)
Albumin: 4.1 g/dL (ref 3.6–5.1)
Alkaline phosphatase (APISO): 55 U/L (ref 37–153)
BUN/Creatinine Ratio: 16 (calc) (ref 6–22)
BUN: 18 mg/dL (ref 7–25)
CO2: 28 mmol/L (ref 20–32)
Calcium: 10 mg/dL (ref 8.6–10.4)
Chloride: 103 mmol/L (ref 98–110)
Creat: 1.15 mg/dL — ABNORMAL HIGH (ref 0.60–0.88)
GFR, Est African American: 52 mL/min/{1.73_m2} — ABNORMAL LOW (ref >=60)
GFR, Est Non African American: 45 mL/min/{1.73_m2} — ABNORMAL LOW (ref >=60)
Globulin: 2.6 g/dL (calc) (ref 1.9–3.7)
Glucose, Bld: 86 mg/dL (ref 65–99)
Potassium: 5 mmol/L (ref 3.5–5.3)
Sodium: 139 mmol/L (ref 135–146)
Total Bilirubin: 0.5 mg/dL (ref 0.2–1.2)
Total Protein: 6.7 g/dL (ref 6.1–8.1)

## 2020-03-20 LAB — T3, FREE: T3, Free: 1.8 pg/mL — ABNORMAL LOW (ref 2.3–4.2)

## 2020-03-20 LAB — T4: T4, Total: 2.2 ug/dL — ABNORMAL LOW (ref 5.1–11.9)

## 2020-03-20 LAB — VITAMIN D 25 HYDROXY (VIT D DEFICIENCY, FRACTURES): Vit D, 25-Hydroxy: 50 ng/mL (ref 30–100)

## 2020-03-20 LAB — TSH: TSH: 111.94 mIU/L — ABNORMAL HIGH (ref 0.40–4.50)

## 2020-03-22 NOTE — Progress Notes (Signed)
Please call the patient and let her know that her thyroid is extremely low and this is why she is probably not feeling well.  She needs to be on NP thyroid if she is willing to go onto this I will send it to whichever pharmacy she wants me to.  Also she needs to be aware of the insurance coverage, or lack cough, with NP thyroid.  Let me know if she would like to go on to NP thyroid and which pharmacy.If she does not want to go on to NP thyroid I will prescribe for her levothyroxine.Either way, let me know.

## 2020-03-23 ENCOUNTER — Telehealth (INDEPENDENT_AMBULATORY_CARE_PROVIDER_SITE_OTHER): Payer: Self-pay

## 2020-03-24 ENCOUNTER — Other Ambulatory Visit (INDEPENDENT_AMBULATORY_CARE_PROVIDER_SITE_OTHER): Payer: Self-pay | Admitting: Internal Medicine

## 2020-03-24 MED ORDER — LEVOTHYROXINE SODIUM 125 MCG PO TABS: 125.0000 ug | ORAL_TABLET | Freq: Every day | ORAL | 3 refills | Status: DC

## 2020-03-24 NOTE — Telephone Encounter (Signed)
Okay, I have sent a prescription of levothyroxine 125 mcg take 1 daily to [facility] pharmacy. Please make sure you arrange a follow-up visit with this patient with me in 1 month time.  Thanks

## 2020-03-24 NOTE — Telephone Encounter (Signed)
Return call to Park Pope, ph: [phone] for the Rx  To be sent to [facility]. She stated which medication would be cost effective for her.

## 2020-03-25 NOTE — Telephone Encounter (Signed)
Appt set for July 8 @ 11am. Your schedule is book out until then.

## 2020-03-27 ENCOUNTER — Encounter: Payer: Self-pay | Admitting: Radiology

## 2020-04-02 ENCOUNTER — Telehealth (INDEPENDENT_AMBULATORY_CARE_PROVIDER_SITE_OTHER): Payer: Self-pay

## 2020-04-03 NOTE — Telephone Encounter (Signed)
Open in error

## 2020-04-10 DIAGNOSIS — M25562 Pain in left knee: Secondary | ICD-10-CM | POA: Diagnosis not present

## 2020-04-10 DIAGNOSIS — M25561 Pain in right knee: Secondary | ICD-10-CM | POA: Diagnosis not present

## 2020-04-13 DIAGNOSIS — L851 Acquired keratosis [keratoderma] palmaris et plantaris: Secondary | ICD-10-CM | POA: Diagnosis not present

## 2020-04-13 DIAGNOSIS — M79675 Pain in left toe(s): Secondary | ICD-10-CM | POA: Diagnosis not present

## 2020-04-13 DIAGNOSIS — M25872 Other specified joint disorders, left ankle and foot: Secondary | ICD-10-CM | POA: Diagnosis not present

## 2020-04-13 DIAGNOSIS — M2012 Hallux valgus (acquired), left foot: Secondary | ICD-10-CM | POA: Diagnosis not present

## 2020-04-14 NOTE — Telephone Encounter (Signed)
error 

## 2020-04-23 NOTE — Telephone Encounter (Signed)
Referral has been met.

## 2020-05-07 ENCOUNTER — Ambulatory Visit (INDEPENDENT_AMBULATORY_CARE_PROVIDER_SITE_OTHER): Payer: PPO | Admitting: Internal Medicine

## 2020-05-07 ENCOUNTER — Ambulatory Visit (INDEPENDENT_AMBULATORY_CARE_PROVIDER_SITE_OTHER): Payer: Medicare HMO | Admitting: Internal Medicine

## 2020-05-11 ENCOUNTER — Encounter (INDEPENDENT_AMBULATORY_CARE_PROVIDER_SITE_OTHER): Payer: Self-pay

## 2020-05-11 ENCOUNTER — Ambulatory Visit (INDEPENDENT_AMBULATORY_CARE_PROVIDER_SITE_OTHER): Payer: Medicare HMO | Admitting: Internal Medicine

## 2020-05-11 ENCOUNTER — Encounter (INDEPENDENT_AMBULATORY_CARE_PROVIDER_SITE_OTHER): Payer: Self-pay | Admitting: Internal Medicine

## 2020-05-11 ENCOUNTER — Other Ambulatory Visit: Payer: Self-pay

## 2020-05-11 VITALS — BP 135/80 | HR 80 | Temp 97.5°F | Ht 67.0 in | Wt 219.4 lb

## 2020-05-11 DIAGNOSIS — E039 Hypothyroidism, unspecified: Secondary | ICD-10-CM | POA: Diagnosis not present

## 2020-05-11 DIAGNOSIS — I10 Essential (primary) hypertension: Secondary | ICD-10-CM

## 2020-05-11 NOTE — Progress Notes (Signed)
Metrics: Intervention Frequency ACO  Documented Smoking Status Yearly  Screened one or more times in 24 months  Cessation Counseling or  Active cessation medication Past 24 months  Past 24 months   Guideline developer: UpToDate (See UpToDate for funding source) Date Released: 2014       Wellness Office Visit  Subjective:  Patient ID: Sherry Navarro, female    DOB: Mar 29, 1941  Age: 79 y.o. MRN: [id number]  CC: This lady comes in for follow-up of hypothyroidism and hypertension HPI  On the last visit, her TSH was significantly elevated but more importantly her T3 levels were very low.  Unfortunately, desiccated thyroid was normal option for this lady's case so we have started her on levothyroxine.  She does not notice any difference in how she feels at the present time. Her hypertension is controlled with amlodipine. Past Medical History:  Diagnosis Date  . AC (acromioclavicular) joint bone spurs   . Arthritis 09/24/2019  . Hiatal hernia   . High cholesterol   . Hypothyroidism 09/24/2019  . Osteoarthritis   . Schizophrenia (HCC)   . Seizures (HCC)   . Thyroid disorder   . Vitamin D deficiency 09/24/2019   Past Surgical History:  Procedure Laterality Date  . back for spinal stenonsis not by Dr. Romeo Apple    . CHOLECYSTECTOMY    . rt knee replacement not by Dr. Romeo Apple       Family History  Problem Relation Age of Onset  . Depression Other   . Schizophrenia Neg Hx   . ADD / ADHD Neg Hx   . Alcohol abuse Neg Hx   . Drug abuse Neg Hx   . Anxiety disorder Neg Hx   . Bipolar disorder Neg Hx   . Dementia Neg Hx   . OCD Neg Hx   . Paranoid behavior Neg Hx   . Seizures Neg Hx   . Sexual abuse Neg Hx   . Physical abuse Neg Hx     Social History   Social History Narrative   Widower since 2010.Married for 37 years.Lives with 33 year old sister.Retired beautitician.   Social History   Tobacco Use  . Smoking status: Former Games developer  . Smokeless tobacco: Never Used   Substance Use Topics  . Alcohol use: No    Current Meds  Medication Sig  . amLODipine (NORVASC) 5 MG tablet TAKE (1) TABLET BY MOUTH ONCE DAILY.  Marland Kitchen Cholecalciferol (VITAMIN D3) 125 MCG (5000 UT) CAPS TAKE 1 CAPSULE BY MOUTH ONCE A DAY.  Marland Kitchen docusate sodium (COLACE) 100 MG capsule TAKE (1) TABLET BY MOUTH TWICE A DAY AS NEEDED FOR MILD CONSTIPATION  . levothyroxine (SYNTHROID) 125 MCG tablet Take 1 tablet (125 mcg total) by mouth daily.  . meloxicam (MOBIC) 7.5 MG tablet TAKE (1) TABLET BY MOUTH ONCE DAILY.  Marland Kitchen perphenazine (TRILAFON) 8 MG tablet TAKE 2 TABLETS BY MOUTH AT BEDTIME.  . traMADol (ULTRAM) 50 MG tablet TAKE 1 TABLET BY MOUTH EVERY (12) HOURS AS NEEDED x UP TO THREE DAYS FOR BREAKTHROUGH PAIN.      Depression screen [facility] 2/9 03/19/2020 04/19/2018  Decreased Interest 0 1  Down, Depressed, Hopeless 0 3  PHQ - 2 Score 0 4  Altered sleeping - 0  Tired, decreased energy - 3  Change in appetite - 0  Feeling bad or failure about yourself  - 1  Trouble concentrating - 0  Moving slowly or fidgety/restless - 1  Suicidal thoughts - 0  PHQ-9 Score - 9  Difficult doing work/chores - Somewhat difficult     Objective:   Today's Vitals: BP 135/80 (BP Location: Right Arm, Patient Position: Sitting, Cuff Size: Normal)   Pulse 80   Temp (!) 97.5 F (36.4 C) (Temporal)   Ht 5\' 7"  (1.702 m)   Wt 219 lb 6.4 oz (99.5 kg)   SpO2 93%   BMI 34.36 kg/m  Vitals with BMI 05/11/2020 03/19/2020 11/26/2019  Height 5\' 7"  5\' 7"  (No Data)  Weight 219 lbs 6 oz 221 lbs 3 oz (No Data)  BMI 34.35 34.64 -  Systolic 135 145 (No Data)  Diastolic 80 75 (No Data)  Pulse 80 79 -  Some encounter information is confidential and restricted. Go to Review Flowsheets activity to see all data.     Physical Exam  She looks systemically well.  She has lost a couple of pounds since the last visit.  Blood pressure is acceptable for her age.  She is alert and orientated without any obvious focal neurological  signs.     Assessment   1. Hypothyroidism, unspecified type   2. Essential hypertension, benign       Tests ordered Orders Placed This Encounter  Procedures  . COMPLETE METABOLIC PANEL WITH GFR  . T3, free  . T4  . TSH     Plan: 1. I will check her thyroid function test again to see if we need to adjust further her levothyroxine dose.  I suspect we will. 2. Her hypertension is well controlled on amlodipine and I will continue this. 3. Follow-up in about 6 weeks to see how she is doing as I suspect will of increased her levothyroxine dose at that point.   No orders of the defined types were placed in this encounter.   11/28/2019, MD

## 2020-05-12 ENCOUNTER — Encounter (INDEPENDENT_AMBULATORY_CARE_PROVIDER_SITE_OTHER): Payer: Self-pay

## 2020-05-12 ENCOUNTER — Other Ambulatory Visit (INDEPENDENT_AMBULATORY_CARE_PROVIDER_SITE_OTHER): Payer: Self-pay | Admitting: Internal Medicine

## 2020-05-12 LAB — COMPLETE METABOLIC PANEL WITH GFR
AG Ratio: 1.4 (calc) (ref 1.0–2.5)
ALT: 16 U/L (ref 6–29)
AST: 20 U/L (ref 10–35)
Albumin: 3.7 g/dL (ref 3.6–5.1)
Alkaline phosphatase (APISO): 56 U/L (ref 37–153)
BUN/Creatinine Ratio: 16 (calc) (ref 6–22)
BUN: 19 mg/dL (ref 7–25)
CO2: 25 mmol/L (ref 20–32)
Calcium: 10.1 mg/dL (ref 8.6–10.4)
Chloride: 104 mmol/L (ref 98–110)
Creat: 1.21 mg/dL — ABNORMAL HIGH (ref 0.60–0.88)
GFR, Est African American: 49 mL/min/{1.73_m2} — ABNORMAL LOW (ref >=60)
GFR, Est Non African American: 42 mL/min/{1.73_m2} — ABNORMAL LOW (ref >=60)
Globulin: 2.6 g/dL (calc) (ref 1.9–3.7)
Glucose, Bld: 96 mg/dL (ref 65–99)
Potassium: 4.5 mmol/L (ref 3.5–5.3)
Sodium: 136 mmol/L (ref 135–146)
Total Bilirubin: 0.4 mg/dL (ref 0.2–1.2)
Total Protein: 6.3 g/dL (ref 6.1–8.1)

## 2020-05-12 LAB — T4: T4, Total: 9.9 ug/dL (ref 5.1–11.9)

## 2020-05-12 LAB — TSH: TSH: 11.03 mIU/L — ABNORMAL HIGH (ref 0.40–4.50)

## 2020-05-12 LAB — T3, FREE: T3, Free: 2.9 pg/mL (ref 2.3–4.2)

## 2020-05-12 LAB — EXTRA LAV TOP TUBE

## 2020-05-12 MED ORDER — LEVOTHYROXINE SODIUM 150 MCG PO TABS: 150.0000 ug | ORAL_TABLET | Freq: Every day | ORAL | 3 refills | Status: AC

## 2020-05-12 NOTE — Progress Notes (Signed)
Called [phone] to give Administrator Westley Foots the information. Asked to fax a copy to add in her chart.

## 2020-05-12 NOTE — Progress Notes (Signed)
Please call this patient.  Tell her that she is dehydrated and she needs to drink more water.  Also, her thyroid is not still well controlled so I have increased the dose of levothyroxine and I have sent a new prescription to [facility] pharmacy.  She should get that soon and start taking it.  Follow-up as scheduled.

## 2020-05-12 NOTE — Progress Notes (Signed)
Calling the home facility to give verbal directions . Will send the letter in mail for patient chart and nurse for instructions.

## 2020-05-21 ENCOUNTER — Other Ambulatory Visit (INDEPENDENT_AMBULATORY_CARE_PROVIDER_SITE_OTHER): Payer: Self-pay | Admitting: Internal Medicine

## 2020-05-21 DIAGNOSIS — F202 Catatonic schizophrenia: Secondary | ICD-10-CM

## 2020-05-29 DIAGNOSIS — Z79899 Other long term (current) drug therapy: Secondary | ICD-10-CM | POA: Diagnosis not present

## 2020-06-12 DIAGNOSIS — Z20828 Contact with and (suspected) exposure to other viral communicable diseases: Secondary | ICD-10-CM | POA: Diagnosis not present

## 2020-06-23 ENCOUNTER — Other Ambulatory Visit (INDEPENDENT_AMBULATORY_CARE_PROVIDER_SITE_OTHER): Payer: Self-pay | Admitting: Nurse Practitioner

## 2020-06-25 ENCOUNTER — Ambulatory Visit (INDEPENDENT_AMBULATORY_CARE_PROVIDER_SITE_OTHER): Payer: PPO | Admitting: Internal Medicine

## 2020-06-25 ENCOUNTER — Encounter (INDEPENDENT_AMBULATORY_CARE_PROVIDER_SITE_OTHER): Payer: Self-pay

## 2020-06-26 DIAGNOSIS — Z20828 Contact with and (suspected) exposure to other viral communicable diseases: Secondary | ICD-10-CM | POA: Diagnosis not present

## 2020-07-17 DIAGNOSIS — Z20828 Contact with and (suspected) exposure to other viral communicable diseases: Secondary | ICD-10-CM | POA: Diagnosis not present

## 2020-07-31 DIAGNOSIS — Z20828 Contact with and (suspected) exposure to other viral communicable diseases: Secondary | ICD-10-CM | POA: Diagnosis not present

## 2020-10-08 DIAGNOSIS — Z20828 Contact with and (suspected) exposure to other viral communicable diseases: Secondary | ICD-10-CM | POA: Diagnosis not present

## 2020-12-02 DIAGNOSIS — Z20828 Contact with and (suspected) exposure to other viral communicable diseases: Secondary | ICD-10-CM | POA: Diagnosis not present

## 2020-12-29 DIAGNOSIS — Z20828 Contact with and (suspected) exposure to other viral communicable diseases: Secondary | ICD-10-CM | POA: Diagnosis not present

## 2021-01-01 DIAGNOSIS — I10 Essential (primary) hypertension: Secondary | ICD-10-CM | POA: Diagnosis not present

## 2021-01-01 DIAGNOSIS — R519 Headache, unspecified: Secondary | ICD-10-CM | POA: Diagnosis not present

## 2021-01-01 DIAGNOSIS — Z20828 Contact with and (suspected) exposure to other viral communicable diseases: Secondary | ICD-10-CM | POA: Diagnosis not present

## 2021-01-16 DIAGNOSIS — B070 Plantar wart: Secondary | ICD-10-CM | POA: Diagnosis not present

## 2021-01-16 DIAGNOSIS — R059 Cough, unspecified: Secondary | ICD-10-CM | POA: Diagnosis not present

## 2021-01-16 DIAGNOSIS — Z20828 Contact with and (suspected) exposure to other viral communicable diseases: Secondary | ICD-10-CM | POA: Diagnosis not present

## 2021-01-16 DIAGNOSIS — J018 Other acute sinusitis: Secondary | ICD-10-CM | POA: Diagnosis not present

## 2021-01-18 DIAGNOSIS — Z20828 Contact with and (suspected) exposure to other viral communicable diseases: Secondary | ICD-10-CM | POA: Diagnosis not present

## 2021-02-12 DIAGNOSIS — J018 Other acute sinusitis: Secondary | ICD-10-CM | POA: Diagnosis not present

## 2021-02-12 DIAGNOSIS — R059 Cough, unspecified: Secondary | ICD-10-CM | POA: Diagnosis not present

## 2021-02-12 DIAGNOSIS — Z20828 Contact with and (suspected) exposure to other viral communicable diseases: Secondary | ICD-10-CM | POA: Diagnosis not present

## 2021-02-12 DIAGNOSIS — B070 Plantar wart: Secondary | ICD-10-CM | POA: Diagnosis not present

## 2021-03-12 DIAGNOSIS — Z20828 Contact with and (suspected) exposure to other viral communicable diseases: Secondary | ICD-10-CM | POA: Diagnosis not present

## 2021-03-29 DIAGNOSIS — Z79899 Other long term (current) drug therapy: Secondary | ICD-10-CM | POA: Diagnosis not present

## 2021-03-29 DIAGNOSIS — Z20828 Contact with and (suspected) exposure to other viral communicable diseases: Secondary | ICD-10-CM | POA: Diagnosis not present

## 2021-03-29 DIAGNOSIS — J018 Other acute sinusitis: Secondary | ICD-10-CM | POA: Diagnosis not present

## 2021-03-29 DIAGNOSIS — R059 Cough, unspecified: Secondary | ICD-10-CM | POA: Diagnosis not present

## 2021-03-29 DIAGNOSIS — B070 Plantar wart: Secondary | ICD-10-CM | POA: Diagnosis not present

## 2021-04-14 DIAGNOSIS — Z20828 Contact with and (suspected) exposure to other viral communicable diseases: Secondary | ICD-10-CM | POA: Diagnosis not present

## 2021-05-11 DIAGNOSIS — Z20828 Contact with and (suspected) exposure to other viral communicable diseases: Secondary | ICD-10-CM | POA: Diagnosis not present

## 2021-06-03 ENCOUNTER — Encounter (INDEPENDENT_AMBULATORY_CARE_PROVIDER_SITE_OTHER): Payer: Self-pay

## 2021-07-26 ENCOUNTER — Ambulatory Visit: Payer: Medicare HMO | Admitting: Adult Health

## 2021-08-19 ENCOUNTER — Encounter: Payer: Self-pay | Admitting: Adult Health

## 2021-08-19 ENCOUNTER — Ambulatory Visit (INDEPENDENT_AMBULATORY_CARE_PROVIDER_SITE_OTHER): Payer: Medicare HMO | Admitting: Adult Health

## 2021-08-19 ENCOUNTER — Other Ambulatory Visit: Payer: Self-pay

## 2021-08-19 ENCOUNTER — Other Ambulatory Visit (HOSPITAL_COMMUNITY)
Admission: RE | Admit: 2021-08-19 | Discharge: 2021-08-19 | Disposition: A | Discharge status: Home / Self Care | Payer: Medicare HMO | Source: Ambulatory Visit | Attending: Adult Health | Admitting: Adult Health

## 2021-08-19 VITALS — BP 153/78 | HR 86 | Ht 67.0 in | Wt 210.0 lb

## 2021-08-19 DIAGNOSIS — L292 Pruritus vulvae: Secondary | ICD-10-CM | POA: Diagnosis not present

## 2021-08-19 DIAGNOSIS — N898 Other specified noninflammatory disorders of vagina: Secondary | ICD-10-CM | POA: Insufficient documentation

## 2021-08-19 DIAGNOSIS — B3731 Acute candidiasis of vulva and vagina: Secondary | ICD-10-CM | POA: Diagnosis not present

## 2021-08-19 MED ORDER — FLUCONAZOLE 100 MG PO TABS: ORAL_TABLET | ORAL | 0 refills | Status: AC

## 2021-08-19 MED ORDER — NYSTATIN 100000 UNIT/GM EX CREA: 1.0000 "application " | TOPICAL_CREAM | Freq: Two times a day (BID) | CUTANEOUS | 3 refills | Status: AC

## 2021-08-19 NOTE — Progress Notes (Signed)
  Subjective:     Patient ID: Sherry Navarro, female   DOB: [birth year], 80 y.o.   MRN: [id number]  HPI [name] is a 80 year old white female, widowed, PM in complaining of itching in groin  and vaginal area.Has discharge at times, seems worse at night.  She resides at [facility] in [place].  Review of Systems Has itching in groin and vaginal area,has scratched till bleed Has discharge at times Reviewed past medical,surgical, social and family history. Reviewed medications and allergies.     Objective:   Physical Exam BP (!) 153/78 (BP Location: Left Arm, Patient Position: Sitting, Cuff Size: Large)   Pulse 86   Ht 5\' 7"  (1.702 m)   Wt 210 lb (95.3 kg)   BMI 32.89 kg/m     Skin warm and dry.Pelvic: external genitalia is normal in appearance,mildly red, has some excoriation on labia, vagina: white discharge without odor,urethra has no lesions or masses noted, cervix:smooth, uterus: normal size, shape and contour, non tender, no masses felt, adnexa: no masses or tenderness noted. Bladder is non tender and no masses felt.  CV swab obtained, and painted with gentian violet. Examination chaperoned by LPN Fall risk is low, uses cane  [facility] - 08/19/21 1025       Pregnancy Intention Screening   Does the patient want to become pregnant in the next year? N/A    Does the patient's partner want to become pregnant in the next year? N/A    Would the patient like to discuss contraceptive options today? N/A      Contraception Wrap Up   Current Method Abstinence    End Method Abstinence    Contraception Counseling Provided No             Assessment:     1. Itching in the vaginal area  2. Vaginal discharge CV swab for BV and yeast   3. Yeast infection involving the vagina and surrounding area Painted with gentian violet Will rx diflucan and nystatin cream Meds ordered this encounter  Medications   fluconazole (DIFLUCAN) 100 MG tablet    Sig: Take 1 daily for 10 days     Dispense:  10 tablet    Refill:  0    Order Specific Question:   Supervising Provider    Answer:   08/21/21 H [[id number]]   nystatin cream (MYCOSTATIN)    Sig: Apply 1 application topically 2 (two) times daily.    Dispense:  30 g    Refill:  3    Order Specific Question:   Supervising Provider    Answer:   Duane Lope [[id number]]       Plan:     Follow up prn

## 2021-08-23 LAB — CERVICOVAGINAL ANCILLARY ONLY
Bacterial Vaginitis (gardnerella): NEGATIVE
Candida Glabrata: NEGATIVE
Candida Vaginitis: NEGATIVE
Comment: NEGATIVE
Comment: NEGATIVE
Comment: NEGATIVE

## 2021-08-24 ENCOUNTER — Telehealth: Payer: Self-pay | Admitting: Internal Medicine

## 2021-08-24 NOTE — Telephone Encounter (Signed)
Sherry Navarro, called on behalf of patient about fax sent over to him (labs) . States that his home phone and fax ae same line  If need to reach him call cell [phone]

## 2021-10-08 DIAGNOSIS — I10 Essential (primary) hypertension: Secondary | ICD-10-CM | POA: Diagnosis not present

## 2021-10-08 DIAGNOSIS — M40299 Other kyphosis, site unspecified: Secondary | ICD-10-CM | POA: Diagnosis not present

## 2021-10-08 DIAGNOSIS — M1990 Unspecified osteoarthritis, unspecified site: Secondary | ICD-10-CM | POA: Diagnosis not present

## 2021-10-21 DIAGNOSIS — F329 Major depressive disorder, single episode, unspecified: Secondary | ICD-10-CM | POA: Diagnosis not present

## 2021-10-21 DIAGNOSIS — F209 Schizophrenia, unspecified: Secondary | ICD-10-CM | POA: Diagnosis not present

## 2021-10-21 DIAGNOSIS — F411 Generalized anxiety disorder: Secondary | ICD-10-CM | POA: Diagnosis not present

## 2022-01-17 DIAGNOSIS — F209 Schizophrenia, unspecified: Secondary | ICD-10-CM | POA: Diagnosis not present

## 2022-01-17 DIAGNOSIS — F411 Generalized anxiety disorder: Secondary | ICD-10-CM | POA: Diagnosis not present

## 2022-01-17 DIAGNOSIS — E0590 Thyrotoxicosis, unspecified without thyrotoxic crisis or storm: Secondary | ICD-10-CM | POA: Diagnosis not present

## 2022-02-04 DIAGNOSIS — R110 Nausea: Secondary | ICD-10-CM | POA: Diagnosis not present

## 2022-02-05 DIAGNOSIS — I700 Atherosclerosis of aorta: Secondary | ICD-10-CM | POA: Diagnosis not present

## 2022-02-05 DIAGNOSIS — Z79899 Other long term (current) drug therapy: Secondary | ICD-10-CM | POA: Diagnosis not present

## 2022-02-05 DIAGNOSIS — E876 Hypokalemia: Secondary | ICD-10-CM | POA: Diagnosis not present

## 2022-02-05 DIAGNOSIS — R1111 Vomiting without nausea: Secondary | ICD-10-CM | POA: Diagnosis not present

## 2022-02-05 DIAGNOSIS — K429 Umbilical hernia without obstruction or gangrene: Secondary | ICD-10-CM | POA: Diagnosis not present

## 2022-02-05 DIAGNOSIS — R0902 Hypoxemia: Secondary | ICD-10-CM | POA: Diagnosis not present

## 2022-02-05 DIAGNOSIS — J189 Pneumonia, unspecified organism: Secondary | ICD-10-CM | POA: Diagnosis not present

## 2022-02-05 DIAGNOSIS — R112 Nausea with vomiting, unspecified: Secondary | ICD-10-CM | POA: Diagnosis not present

## 2022-02-05 DIAGNOSIS — Z79890 Hormone replacement therapy (postmenopausal): Secondary | ICD-10-CM | POA: Diagnosis not present

## 2022-02-05 DIAGNOSIS — E039 Hypothyroidism, unspecified: Secondary | ICD-10-CM | POA: Diagnosis not present

## 2022-02-05 DIAGNOSIS — Z9049 Acquired absence of other specified parts of digestive tract: Secondary | ICD-10-CM | POA: Diagnosis not present

## 2022-02-05 DIAGNOSIS — F209 Schizophrenia, unspecified: Secondary | ICD-10-CM | POA: Diagnosis not present

## 2022-02-05 DIAGNOSIS — R7989 Other specified abnormal findings of blood chemistry: Secondary | ICD-10-CM | POA: Diagnosis not present

## 2022-02-05 DIAGNOSIS — K56601 Complete intestinal obstruction, unspecified as to cause: Secondary | ICD-10-CM | POA: Diagnosis not present

## 2022-02-05 DIAGNOSIS — R531 Weakness: Secondary | ICD-10-CM | POA: Diagnosis not present

## 2022-02-05 DIAGNOSIS — Z20822 Contact with and (suspected) exposure to covid-19: Secondary | ICD-10-CM | POA: Diagnosis not present

## 2022-02-05 DIAGNOSIS — J9811 Atelectasis: Secondary | ICD-10-CM | POA: Diagnosis not present

## 2022-02-05 DIAGNOSIS — E7800 Pure hypercholesterolemia, unspecified: Secondary | ICD-10-CM | POA: Diagnosis not present

## 2022-02-05 DIAGNOSIS — N179 Acute kidney failure, unspecified: Secondary | ICD-10-CM | POA: Diagnosis not present

## 2022-02-05 DIAGNOSIS — K56609 Unspecified intestinal obstruction, unspecified as to partial versus complete obstruction: Secondary | ICD-10-CM | POA: Diagnosis not present

## 2022-02-05 DIAGNOSIS — E860 Dehydration: Secondary | ICD-10-CM | POA: Diagnosis not present

## 2022-02-05 DIAGNOSIS — I10 Essential (primary) hypertension: Secondary | ICD-10-CM | POA: Diagnosis not present

## 2022-02-05 DIAGNOSIS — Z7401 Bed confinement status: Secondary | ICD-10-CM | POA: Diagnosis not present

## 2022-02-05 DIAGNOSIS — K420 Umbilical hernia with obstruction, without gangrene: Secondary | ICD-10-CM | POA: Diagnosis not present

## 2022-02-05 DIAGNOSIS — K5730 Diverticulosis of large intestine without perforation or abscess without bleeding: Secondary | ICD-10-CM | POA: Diagnosis not present

## 2022-02-06 DIAGNOSIS — I10 Essential (primary) hypertension: Secondary | ICD-10-CM | POA: Diagnosis not present

## 2022-02-06 DIAGNOSIS — N179 Acute kidney failure, unspecified: Secondary | ICD-10-CM | POA: Diagnosis not present

## 2022-02-06 DIAGNOSIS — R7989 Other specified abnormal findings of blood chemistry: Secondary | ICD-10-CM | POA: Diagnosis not present

## 2022-02-06 DIAGNOSIS — F209 Schizophrenia, unspecified: Secondary | ICD-10-CM | POA: Diagnosis not present

## 2022-02-06 DIAGNOSIS — Z79899 Other long term (current) drug therapy: Secondary | ICD-10-CM | POA: Diagnosis not present

## 2022-02-06 DIAGNOSIS — K429 Umbilical hernia without obstruction or gangrene: Secondary | ICD-10-CM | POA: Diagnosis not present

## 2022-02-06 DIAGNOSIS — E039 Hypothyroidism, unspecified: Secondary | ICD-10-CM | POA: Diagnosis not present

## 2022-02-06 DIAGNOSIS — E876 Hypokalemia: Secondary | ICD-10-CM | POA: Diagnosis not present

## 2022-02-06 DIAGNOSIS — K56609 Unspecified intestinal obstruction, unspecified as to partial versus complete obstruction: Secondary | ICD-10-CM | POA: Diagnosis not present

## 2022-02-07 DIAGNOSIS — F209 Schizophrenia, unspecified: Secondary | ICD-10-CM | POA: Diagnosis not present

## 2022-02-07 DIAGNOSIS — K56609 Unspecified intestinal obstruction, unspecified as to partial versus complete obstruction: Secondary | ICD-10-CM | POA: Diagnosis not present

## 2022-02-07 DIAGNOSIS — E039 Hypothyroidism, unspecified: Secondary | ICD-10-CM | POA: Diagnosis not present

## 2022-02-07 DIAGNOSIS — Z9049 Acquired absence of other specified parts of digestive tract: Secondary | ICD-10-CM | POA: Diagnosis not present

## 2022-02-07 DIAGNOSIS — E876 Hypokalemia: Secondary | ICD-10-CM | POA: Diagnosis not present

## 2022-02-07 DIAGNOSIS — K429 Umbilical hernia without obstruction or gangrene: Secondary | ICD-10-CM | POA: Diagnosis not present

## 2022-02-11 ENCOUNTER — Other Ambulatory Visit: Payer: Self-pay

## 2022-02-11 NOTE — Patient Outreach (Signed)
[facility] Customer service manager [facility]) Care Management ? ?02/11/2022 ? ?Marti Sleigh ?[birth year] ?[id number] ? ? ? ? ?Transition of Care Referral ? ?Referral Date: 02/11/2022 ?Referral Source:Discharge Report ?Date of Discharge: 02/10/2022  ?Facility: [facility] ? ? ? ? ?Referral received. Upon chart review noted that patient resident at ALF and followed by non [facility] PCP.  ? ? ? ?Plan: ?RN CM will close referral.  ? ? ?Antionette Fairy, RN,BSN,CCM ?[facility] Care Management ?Telephonic Care Management Coordinator ?Direct Phone: [phone] ?Toll Free: [phone] ?Fax: [phone] ? ?

## 2022-02-11 NOTE — Patient Outreach (Signed)
Received a [facility] discharge notification for Ms. Sherry Navarro. ?I have assigned Antionette Fairy, RN to call for follow up and determine if there are any Case Management needs.  ?  ?Iverson Alamin, CBCS, CMAA ?[facility] Care Management Assistant ?[facility] Care Management ?[phone]   ?

## 2022-02-25 DIAGNOSIS — R410 Disorientation, unspecified: Secondary | ICD-10-CM | POA: Diagnosis not present

## 2022-02-25 DIAGNOSIS — S0993XA Unspecified injury of face, initial encounter: Secondary | ICD-10-CM | POA: Diagnosis not present

## 2022-02-25 DIAGNOSIS — F209 Schizophrenia, unspecified: Secondary | ICD-10-CM | POA: Diagnosis not present

## 2022-02-25 DIAGNOSIS — S50811A Abrasion of right forearm, initial encounter: Secondary | ICD-10-CM | POA: Diagnosis not present

## 2022-02-25 DIAGNOSIS — S0990XA Unspecified injury of head, initial encounter: Secondary | ICD-10-CM | POA: Diagnosis not present

## 2022-02-25 DIAGNOSIS — R4182 Altered mental status, unspecified: Secondary | ICD-10-CM | POA: Diagnosis not present

## 2022-02-25 DIAGNOSIS — W19XXXA Unspecified fall, initial encounter: Secondary | ICD-10-CM | POA: Diagnosis not present

## 2022-02-25 DIAGNOSIS — M542 Cervicalgia: Secondary | ICD-10-CM | POA: Diagnosis not present

## 2022-02-25 DIAGNOSIS — W108XXA Fall (on) (from) other stairs and steps, initial encounter: Secondary | ICD-10-CM | POA: Diagnosis not present

## 2022-02-25 DIAGNOSIS — F0390 Unspecified dementia without behavioral disturbance: Secondary | ICD-10-CM | POA: Diagnosis not present

## 2022-02-25 DIAGNOSIS — R4689 Other symptoms and signs involving appearance and behavior: Secondary | ICD-10-CM | POA: Diagnosis not present

## 2022-02-25 DIAGNOSIS — Z87891 Personal history of nicotine dependence: Secondary | ICD-10-CM | POA: Diagnosis not present

## 2022-02-25 DIAGNOSIS — R519 Headache, unspecified: Secondary | ICD-10-CM | POA: Diagnosis not present

## 2022-02-25 DIAGNOSIS — M1990 Unspecified osteoarthritis, unspecified site: Secondary | ICD-10-CM | POA: Diagnosis not present

## 2022-02-25 DIAGNOSIS — I10 Essential (primary) hypertension: Secondary | ICD-10-CM | POA: Diagnosis not present

## 2022-02-25 DIAGNOSIS — E785 Hyperlipidemia, unspecified: Secondary | ICD-10-CM | POA: Diagnosis not present

## 2022-03-14 DIAGNOSIS — G20 Parkinson's disease: Secondary | ICD-10-CM | POA: Diagnosis not present

## 2022-03-14 DIAGNOSIS — F209 Schizophrenia, unspecified: Secondary | ICD-10-CM | POA: Diagnosis not present

## 2022-03-14 DIAGNOSIS — Z79899 Other long term (current) drug therapy: Secondary | ICD-10-CM | POA: Diagnosis not present

## 2022-03-14 DIAGNOSIS — Z96651 Presence of right artificial knee joint: Secondary | ICD-10-CM | POA: Diagnosis not present

## 2022-03-14 DIAGNOSIS — E785 Hyperlipidemia, unspecified: Secondary | ICD-10-CM | POA: Diagnosis not present

## 2022-03-14 DIAGNOSIS — I10 Essential (primary) hypertension: Secondary | ICD-10-CM | POA: Diagnosis not present

## 2022-03-14 DIAGNOSIS — Z87891 Personal history of nicotine dependence: Secondary | ICD-10-CM | POA: Diagnosis not present

## 2022-03-14 DIAGNOSIS — M159 Polyosteoarthritis, unspecified: Secondary | ICD-10-CM | POA: Diagnosis not present

## 2022-03-14 DIAGNOSIS — Z7689 Persons encountering health services in other specified circumstances: Secondary | ICD-10-CM | POA: Diagnosis not present

## 2022-04-28 DIAGNOSIS — L97519 Non-pressure chronic ulcer of other part of right foot with unspecified severity: Secondary | ICD-10-CM | POA: Diagnosis not present

## 2022-04-28 DIAGNOSIS — F329 Major depressive disorder, single episode, unspecified: Secondary | ICD-10-CM | POA: Diagnosis not present

## 2022-04-28 DIAGNOSIS — Z6831 Body mass index (BMI) 31.0-31.9, adult: Secondary | ICD-10-CM | POA: Diagnosis not present

## 2022-04-28 DIAGNOSIS — I10 Essential (primary) hypertension: Secondary | ICD-10-CM | POA: Diagnosis not present

## 2022-04-28 DIAGNOSIS — G250 Essential tremor: Secondary | ICD-10-CM | POA: Diagnosis not present

## 2022-04-28 DIAGNOSIS — R451 Restlessness and agitation: Secondary | ICD-10-CM | POA: Diagnosis not present

## 2022-04-28 DIAGNOSIS — E039 Hypothyroidism, unspecified: Secondary | ICD-10-CM | POA: Diagnosis not present

## 2022-05-24 ENCOUNTER — Encounter: Payer: Medicare HMO | Admitting: *Deleted

## 2022-05-24 ENCOUNTER — Telehealth: Payer: Self-pay | Admitting: *Deleted

## 2022-05-24 NOTE — Patient Outreach (Signed)
  Care Management   Initial Note   05/24/2022  Name: Sherry Navarro MRN: [id number] DOB: 1941/03/03  Referred By: Wilson Singer, MD (Inactive)  Reason for Referral: Care Coordination - Assessment of Needs.  An unsuccessful telephone outreach was attempted today. The patient was referred to the case management team for assistance with care management and care coordination. A HIPAA compliant message was left on voicemail for patient, providing contact information for CSW, encouraging patient to return the call at her earliest convenience.  Follow Up Plan:  Scheduling Care Guide will reschedule initial telephone outreach call for patient with CSW.  Danford Bad, BSW, MSW, LCSW  Licensed Restaurant manager, fast food [facility]  Mailing [place] [address] [place], [place] [address] Physical Address[address] [place], [place] [address] Toll Free Main # [phone] Fax # [phone] Cell # [phone] [email or web address]

## 2022-06-02 ENCOUNTER — Ambulatory Visit: Payer: Medicare HMO | Admitting: *Deleted

## 2022-06-02 ENCOUNTER — Encounter: Payer: Self-pay | Admitting: *Deleted

## 2022-06-02 NOTE — Patient Instructions (Signed)
Visit Information  Thank you for taking time to visit with me today. Please don't hesitate to contact me if I can be of assistance to you.   Please call the care guide team at [phone] if you need to cancel or reschedule your appointment.   If you are experiencing a Mental Health or Behavioral Health Crisis or need someone to talk to, please call the Suicide and Crisis Lifeline: 988 call the [place] National Suicide Prevention Lifeline: 1-800-273-8255 or TTY: 1-800-799-4 TTY (1-800-799-4889) to talk to a trained counselor call 1-800-273-TALK (toll free, 24 hour hotline) go to [facility] [address], [place] ([phone]) call the [place] Crisis Line: 800-939-9988 call 911  Patient verbalizes understanding of instructions and care plan provided today and agrees to view in MyChart. Active MyChart status and patient understanding of how to access instructions and care plan via MyChart confirmed with patient.     No further follow up required.  Delayni Streed, BSW, MSW, LCSW  Licensed Clinical Social Worker  [facility] Care Management [place] [facility]  Mailing Address-[address], [place], [place] [address] Physical Address-[address], [place], [place] [address] Toll Free Main # [phone] Fax # [phone] Cell # [phone] [email or web address]            

## 2022-06-02 NOTE — Patient Outreach (Signed)
  Care Coordination   Initial Visit Note   06/02/2022  Name: Sherry Navarro MRN: [id number] DOB: 09/13/1941  Sherry Navarro is a 81 y.o. year old female who sees Karilyn Cota, Corena Herter, MD (Inactive) for primary care. I spoke with  Sherry Navarro by phone today.  What matters to the patients health and wellness today?  No Interventions Identified. Communicate Bilateral Ear Infection to Primary Care Provider, Dr. Reuel Derby.   Goals Addressed   None     SDOH assessments and interventions completed:  Yes  SDOH Interventions Today    Flowsheet Row Most Recent Value  SDOH Interventions   Food Insecurity Interventions Intervention Not Indicated  Financial Strain Interventions Intervention Not Indicated  Housing Interventions Intervention Not Indicated  Physical Activity Interventions Intervention Not Indicated  Stress Interventions Intervention Not Indicated  Social Connections Interventions Intervention Not Indicated  Transportation Interventions Intervention Not Indicated        Care Coordination Interventions Activated:  Yes   Care Coordination Interventions:  Yes, provided.   Follow up plan: No further intervention required.   Encounter Outcome:  Pt. Visit Completed.   Sherry Navarro, Sherry Navarro, Sherry Navarro, Sherry Navarro  Licensed Restaurant manager, fast food [facility]  Mailing [place] [address] [place], [place] [address] Physical Address[address], [place], [place] [address] Toll Free Main # [phone] Fax # [phone] Cell # [phone] [email or web address]

## 2022-06-24 DIAGNOSIS — F411 Generalized anxiety disorder: Secondary | ICD-10-CM | POA: Diagnosis not present

## 2022-06-24 DIAGNOSIS — F209 Schizophrenia, unspecified: Secondary | ICD-10-CM | POA: Diagnosis not present

## 2022-06-24 DIAGNOSIS — F0283 Dementia in other diseases classified elsewhere, unspecified severity, with mood disturbance: Secondary | ICD-10-CM | POA: Diagnosis not present

## 2022-07-14 DIAGNOSIS — F411 Generalized anxiety disorder: Secondary | ICD-10-CM | POA: Diagnosis not present

## 2022-07-14 DIAGNOSIS — F200 Paranoid schizophrenia: Secondary | ICD-10-CM | POA: Diagnosis not present

## 2022-07-14 DIAGNOSIS — F329 Major depressive disorder, single episode, unspecified: Secondary | ICD-10-CM | POA: Diagnosis not present

## 2022-08-15 DIAGNOSIS — F411 Generalized anxiety disorder: Secondary | ICD-10-CM | POA: Diagnosis not present

## 2022-08-15 DIAGNOSIS — F0283 Dementia in other diseases classified elsewhere, unspecified severity, with mood disturbance: Secondary | ICD-10-CM | POA: Diagnosis not present

## 2022-08-15 DIAGNOSIS — F209 Schizophrenia, unspecified: Secondary | ICD-10-CM | POA: Diagnosis not present

## 2022-08-15 DIAGNOSIS — Z593 Problems related to living in residential institution: Secondary | ICD-10-CM | POA: Diagnosis not present

## 2022-08-26 DIAGNOSIS — Z79899 Other long term (current) drug therapy: Secondary | ICD-10-CM | POA: Diagnosis not present

## 2022-08-26 DIAGNOSIS — E785 Hyperlipidemia, unspecified: Secondary | ICD-10-CM | POA: Diagnosis not present

## 2022-08-26 DIAGNOSIS — N182 Chronic kidney disease, stage 2 (mild): Secondary | ICD-10-CM | POA: Diagnosis not present

## 2022-08-26 DIAGNOSIS — K5900 Constipation, unspecified: Secondary | ICD-10-CM | POA: Diagnosis not present

## 2022-08-26 DIAGNOSIS — I10 Essential (primary) hypertension: Secondary | ICD-10-CM | POA: Diagnosis not present

## 2022-08-26 DIAGNOSIS — E039 Hypothyroidism, unspecified: Secondary | ICD-10-CM | POA: Diagnosis not present

## 2022-08-26 DIAGNOSIS — Z6832 Body mass index (BMI) 32.0-32.9, adult: Secondary | ICD-10-CM | POA: Diagnosis not present

## 2022-08-26 DIAGNOSIS — Z23 Encounter for immunization: Secondary | ICD-10-CM | POA: Diagnosis not present

## 2022-09-01 DIAGNOSIS — F0283 Dementia in other diseases classified elsewhere, unspecified severity, with mood disturbance: Secondary | ICD-10-CM | POA: Diagnosis not present

## 2022-09-01 DIAGNOSIS — F411 Generalized anxiety disorder: Secondary | ICD-10-CM | POA: Diagnosis not present

## 2022-09-01 DIAGNOSIS — F200 Paranoid schizophrenia: Secondary | ICD-10-CM | POA: Diagnosis not present

## 2022-09-01 DIAGNOSIS — F329 Major depressive disorder, single episode, unspecified: Secondary | ICD-10-CM | POA: Diagnosis not present

## 2022-09-29 DIAGNOSIS — F0283 Dementia in other diseases classified elsewhere, unspecified severity, with mood disturbance: Secondary | ICD-10-CM | POA: Diagnosis not present

## 2022-09-29 DIAGNOSIS — F200 Paranoid schizophrenia: Secondary | ICD-10-CM | POA: Diagnosis not present

## 2022-09-29 DIAGNOSIS — F329 Major depressive disorder, single episode, unspecified: Secondary | ICD-10-CM | POA: Diagnosis not present

## 2022-10-13 DIAGNOSIS — F411 Generalized anxiety disorder: Secondary | ICD-10-CM | POA: Diagnosis not present

## 2022-10-13 DIAGNOSIS — F329 Major depressive disorder, single episode, unspecified: Secondary | ICD-10-CM | POA: Diagnosis not present

## 2022-10-13 DIAGNOSIS — F200 Paranoid schizophrenia: Secondary | ICD-10-CM | POA: Diagnosis not present

## 2022-10-13 DIAGNOSIS — R251 Tremor, unspecified: Secondary | ICD-10-CM | POA: Diagnosis not present

## 2022-10-17 ENCOUNTER — Encounter: Payer: Self-pay | Admitting: *Deleted

## 2022-10-17 ENCOUNTER — Ambulatory Visit: Payer: Self-pay | Admitting: *Deleted

## 2022-10-17 NOTE — Patient Instructions (Signed)
Visit Information  Thank you for taking time to visit with me today. Please don't hesitate to contact me if I can be of assistance to you.   Please call the care guide team at [phone] if you need to cancel or reschedule your appointment.   If you are experiencing a Mental Health or Behavioral Health Crisis or need someone to talk to, please call the Suicide and Crisis Lifeline: 988 call the [place] National Suicide Prevention Lifeline: 1-800-273-8255 or TTY: 1-800-799-4 TTY (1-800-799-4889) to talk to a trained counselor call 1-800-273-TALK (toll free, 24 hour hotline) go to [facility] [address], [place] ([phone]) call the [place] Crisis Line: 800-939-9988 call 911  Patient verbalizes understanding of instructions and care plan provided today and agrees to view in MyChart. Active MyChart status and patient understanding of how to access instructions and care plan via MyChart confirmed with patient.     No further follow up required.  Demetris Capell, BSW, MSW, LCSW  Licensed Clinical Social Worker  [facility] Care Management [facility] [facility]  Mailing Address-[address], [place], [place] [address] Physical Address-[address], [place], [place] [address] Toll Free Main # [phone] Fax # [phone] Cell # [phone] [email or web address]            

## 2022-10-17 NOTE — Patient Outreach (Signed)
  Care Coordination   Initial Visit Note   10/17/2022  Name: Sherry Navarro MRN: [id number] DOB: 1941-01-01  LOLAH COGHLAN is a 81 y.o. year old female who sees Karilyn Cota, Corena Herter, MD (Inactive) for primary care. I spoke with Marti Sleigh by phone today.  What matters to the patients health and wellness today?  No Interventions Identified.   SDOH assessments and interventions completed:  Yes.  SDOH Interventions Today    Flowsheet Row Most Recent Value  SDOH Interventions   Food Insecurity Interventions Intervention Not Indicated  Housing Interventions Intervention Not Indicated  Transportation Interventions Intervention Not Indicated, Patient Resources (Friends/Family), Payor Benefit  Utilities Interventions Intervention Not Indicated  Alcohol Usage Interventions Intervention Not Indicated (Score <7)  Financial Strain Interventions Intervention Not Indicated  Physical Activity Interventions Intervention Not Indicated  Stress Interventions Intervention Not Indicated  Social Connections Interventions Intervention Not Indicated     Care Coordination Interventions:  Yes, provided.   Follow up plan: No further intervention required.   Encounter Outcome:  Pt. Visit Completed.   Danford Bad, BSW, MSW, LCSW  Licensed Restaurant manager, fast food [facility]  Mailing [place] [address], [place], [place] [address] Physical Address-[address] [place], [place] [address] Toll Free Main # [phone] Fax # [phone] Cell # [phone] [email or web address]

## 2023-11-22 ENCOUNTER — Other Ambulatory Visit: Payer: Self-pay | Admitting: Internal Medicine

## 2023-11-22 ENCOUNTER — Ambulatory Visit: Payer: Self-pay | Admitting: Internal Medicine

## 2023-11-22 NOTE — Telephone Encounter (Signed)
Copied from CRM [id number]. Topic: Clinical - Medication Refill >> Nov 22, 2023 10:35 AM Carlatta H wrote: Most Recent Primary Care Visit:   Medication: sertraline hcl 25mg  Tablet  Has the patient contacted their pharmacy? No (Agent: If no, request that the patient contact the pharmacy for the refill. If patient does not wish to contact the pharmacy document the reason why and proceed with request.) (Agent: If yes, when and what did the pharmacy advise?)  Is this the correct pharmacy for this prescription? Yes If no, delete pharmacy and type the correct one.  This is the patient's preferred pharmacy:  [facility] - [place], [place] - [address] [place] [address] Phone: [phone] Fax: [phone]  [facility] - [place], [place] - [address] [place] [address] Phone: [phone] Fax: [phone]   Has the prescription been filled recently? No  Is the patient out of the medication? Yes  Has the patient been seen for an appointment in the last year OR does the patient have an upcoming appointment? Yes  Can we respond through MyChart? No  Agent: Please be advised that Rx refills may take up to 3 business days. We ask that you follow-up with your pharmacy.

## 2023-11-22 NOTE — Telephone Encounter (Signed)
Contacted Patient  who is a in a assistive living the CMA contacted  incorrect provider.

## 2023-11-22 NOTE — Telephone Encounter (Signed)
Contacted pt . Patient is in a Assistive Living the CMA states he called the wrong provider. She will reach out to her provider who prescribed the medication.
